# Patient Record
Sex: Male | Born: 1965 | Race: Black or African American | Hispanic: No | Marital: Married | Smoking: Never smoker
Health system: Southern US, Community
[De-identification: ages and names within clinical notes are randomized; demographics above are authoritative.]

## PROBLEM LIST (undated history)

## (undated) DIAGNOSIS — K562 Volvulus: Secondary | ICD-10-CM

## (undated) HISTORY — DX: Volvulus: K56.2

---

## 2001-06-06 ENCOUNTER — Emergency Department (HOSPITAL_COMMUNITY): Admission: EM | Admit: 2001-06-06 | Discharge: 2001-06-06 | Payer: Self-pay | Admitting: *Deleted

## 2003-09-27 ENCOUNTER — Encounter: Admission: RE | Admit: 2003-09-27 | Discharge: 2003-09-27 | Payer: Self-pay | Admitting: Specialist

## 2004-12-28 ENCOUNTER — Emergency Department (HOSPITAL_COMMUNITY): Admission: EM | Admit: 2004-12-28 | Discharge: 2004-12-28 | Payer: Self-pay | Admitting: Family Medicine

## 2005-04-18 ENCOUNTER — Emergency Department (HOSPITAL_COMMUNITY): Admission: EM | Admit: 2005-04-18 | Discharge: 2005-04-18 | Payer: Self-pay | Admitting: Family Medicine

## 2005-05-10 ENCOUNTER — Emergency Department (HOSPITAL_COMMUNITY): Admission: EM | Admit: 2005-05-10 | Discharge: 2005-05-10 | Payer: Self-pay | Admitting: Emergency Medicine

## 2005-05-28 ENCOUNTER — Ambulatory Visit: Payer: Self-pay | Admitting: Internal Medicine

## 2005-06-25 ENCOUNTER — Ambulatory Visit: Payer: Self-pay | Admitting: Sports Medicine

## 2005-08-16 ENCOUNTER — Ambulatory Visit: Payer: Self-pay | Admitting: Family Medicine

## 2005-09-22 ENCOUNTER — Emergency Department (HOSPITAL_COMMUNITY): Admission: EM | Admit: 2005-09-22 | Discharge: 2005-09-22 | Payer: Self-pay | Admitting: Emergency Medicine

## 2005-10-23 ENCOUNTER — Emergency Department (HOSPITAL_COMMUNITY): Admission: EM | Admit: 2005-10-23 | Discharge: 2005-10-24 | Payer: Self-pay | Admitting: Emergency Medicine

## 2005-10-24 ENCOUNTER — Emergency Department (HOSPITAL_COMMUNITY): Admission: EM | Admit: 2005-10-24 | Discharge: 2005-10-24 | Payer: Self-pay | Admitting: Emergency Medicine

## 2005-12-18 ENCOUNTER — Ambulatory Visit: Payer: Self-pay | Admitting: Family Medicine

## 2006-01-01 ENCOUNTER — Ambulatory Visit: Payer: Self-pay | Admitting: Family Medicine

## 2006-07-29 ENCOUNTER — Ambulatory Visit (HOSPITAL_COMMUNITY): Admission: RE | Admit: 2006-07-29 | Discharge: 2006-07-29 | Payer: Self-pay | Admitting: Family Medicine

## 2006-07-29 ENCOUNTER — Ambulatory Visit: Payer: Self-pay | Admitting: Family Medicine

## 2006-10-29 DIAGNOSIS — K562 Volvulus: Secondary | ICD-10-CM

## 2006-10-29 HISTORY — DX: Volvulus: K56.2

## 2006-10-29 HISTORY — PX: LAPAROSCOPIC SIGMOID COLECTOMY: SHX5928

## 2006-12-26 DIAGNOSIS — K589 Irritable bowel syndrome without diarrhea: Secondary | ICD-10-CM

## 2006-12-26 DIAGNOSIS — K210 Gastro-esophageal reflux disease with esophagitis: Secondary | ICD-10-CM

## 2006-12-26 DIAGNOSIS — J309 Allergic rhinitis, unspecified: Secondary | ICD-10-CM | POA: Insufficient documentation

## 2007-06-23 ENCOUNTER — Ambulatory Visit: Payer: Self-pay | Admitting: Family Medicine

## 2007-06-23 ENCOUNTER — Inpatient Hospital Stay (HOSPITAL_COMMUNITY): Admission: EM | Admit: 2007-06-23 | Discharge: 2007-07-05 | Payer: Self-pay | Admitting: Emergency Medicine

## 2007-06-23 ENCOUNTER — Ambulatory Visit: Payer: Self-pay | Admitting: Sports Medicine

## 2007-06-26 ENCOUNTER — Encounter (INDEPENDENT_AMBULATORY_CARE_PROVIDER_SITE_OTHER): Payer: Self-pay | Admitting: Surgery

## 2007-07-15 ENCOUNTER — Encounter: Payer: Self-pay | Admitting: Family Medicine

## 2007-08-05 ENCOUNTER — Encounter: Payer: Self-pay | Admitting: Family Medicine

## 2007-11-21 ENCOUNTER — Ambulatory Visit: Payer: Self-pay | Admitting: Family Medicine

## 2007-11-21 ENCOUNTER — Telehealth: Payer: Self-pay | Admitting: *Deleted

## 2007-11-21 DIAGNOSIS — J029 Acute pharyngitis, unspecified: Secondary | ICD-10-CM | POA: Insufficient documentation

## 2007-11-21 LAB — CONVERTED CEMR LAB: Rapid Strep: POSITIVE

## 2007-11-24 ENCOUNTER — Encounter: Payer: Self-pay | Admitting: Family Medicine

## 2007-12-05 ENCOUNTER — Telehealth: Payer: Self-pay | Admitting: *Deleted

## 2007-12-09 ENCOUNTER — Telehealth: Payer: Self-pay | Admitting: *Deleted

## 2008-02-25 ENCOUNTER — Telehealth: Payer: Self-pay | Admitting: *Deleted

## 2008-02-26 ENCOUNTER — Ambulatory Visit: Payer: Self-pay | Admitting: Family Medicine

## 2008-02-26 DIAGNOSIS — M549 Dorsalgia, unspecified: Secondary | ICD-10-CM | POA: Insufficient documentation

## 2008-04-14 ENCOUNTER — Ambulatory Visit: Payer: Self-pay | Admitting: Family Medicine

## 2008-04-14 DIAGNOSIS — M5430 Sciatica, unspecified side: Secondary | ICD-10-CM | POA: Insufficient documentation

## 2009-08-19 ENCOUNTER — Ambulatory Visit: Payer: Self-pay | Admitting: Family Medicine

## 2009-08-19 LAB — CONVERTED CEMR LAB: Rapid Strep: POSITIVE

## 2010-02-03 ENCOUNTER — Telehealth: Payer: Self-pay | Admitting: Family Medicine

## 2010-03-28 ENCOUNTER — Ambulatory Visit: Payer: Self-pay | Admitting: Family Medicine

## 2010-03-28 DIAGNOSIS — J111 Influenza due to unidentified influenza virus with other respiratory manifestations: Secondary | ICD-10-CM | POA: Insufficient documentation

## 2010-03-28 LAB — CONVERTED CEMR LAB: Rapid Strep: NEGATIVE

## 2010-11-28 NOTE — Assessment & Plan Note (Signed)
Summary: flu,tcb   Vital Signs:  Patient profile:   45 year old male Weight:      206 pounds Temp:     100.6 degrees F oral Pulse rate:   92 / minute BP sitting:   116 / 75  Vitals Entered By: Jone Baseman CMA (Mar 28, 2010 10:08 AM) CC: ? flu Is Patient Diabetic? No Pain Assessment Patient in pain? yes     Location: all over Intensity: 9   Primary Care Provider:  Neena Rhymes  MD  CC:  ? flu.  History of Present Illness: 1. Flu like symptoms:  pt presents to clinic with flu like symptoms for 1 day.  They started all of a sudden yesterday afternoon.  His symptoms include body aches, fever, sore throat, runny nose, and headache.  No one else has been sick around him.  He has not been outside recently or had any tick bites      ROS: endorses diarrhea.  Denies vision changes, n/v, abdominal pain, stiff neck, rashes  Habits & Providers  Alcohol-Tobacco-Diet     Tobacco Status: never  Current Medications (verified): 1)  Flexeril 10 Mg  Tabs (Cyclobenzaprine Hcl) .... One Tablet Up To Three Times A Day As Needed Pain (Caution - Will Cause Sleepiness) 2)  Ibuprofen 800 Mg  Tabs (Ibuprofen) .... One Tablet By Mouth Three Times A Day For 5 Days Then As Needed 3)  Omeprazole 40 Mg  Cpdr (Omeprazole) .Marland Kitchen.. 1 Tab By Mouth Daily 4)  Sudafed 30 Mg Tabs (Pseudoephedrine Hcl) .... Take 1-2 Tabs Every 6 Hours As Needed For Congestion 5)  Doxycycline Hyclate 100 Mg Tabs (Doxycycline Hyclate) .... Take 1 Tab By Mouth Twice A Day For 7 Days  Allergies: No Known Drug Allergies  Past History:  Past Medical History: Reviewed history from 12/26/2006 and no changes required. IBS predominantely constipated  Social History: Reviewed history from 12/26/2006 and no changes required. Lives with wife and 5 children- oldest daughter is 75, youngest is 2.  Works as a Architectural technologist.  Arrived in [place] from [place] in 2000.  Physical Exam  General:  VS reviewed, low grade fever  Well-developed,well-nourished,in no acute distress; alert,appropriate and cooperative throughout examination Head:  normocephalic and atraumatic.   Eyes:  vision grossly intact.  PERRL.  EOMI.  conjunctiva slightly irritated. Ears:  R ear normal and L ear normal.   Nose:  clear nasal discharge Mouth:  pharnyx red without exudate Neck:  supple, full ROM, and no masses.  No meningeal signs. Lungs:  Normal respiratory effort, chest expands symmetrically. Lungs are clear to auscultation, no crackles or wheezes. Heart:  Normal rate and regular rhythm. S1 and S2 normal without gallop, murmur, click, rub or other extra sounds. Abdomen:  soft, non-tender, normal bowel sounds, no distention, no masses, and no guarding.   Msk:  normal ROM, no joint tenderness, and no joint swelling.   Extremities:  no lower extremity edema Neurologic:  alert & oriented X3, cranial nerves II-XII intact, strength normal in all extremities, and sensation intact to light touch.   Skin:  no rashes and no suspicious lesions.   Psych:  not anxious appearing.   Additional Exam:  Exam after Toradol injection:  Feeling much better.  Headache and body aches improved.   Impression & Recommendations:  Problem # 1:  INFLUENZA LIKE ILLNESS (ICD-487.1) Assessment New  Likely viral.  Low risk for RMSF but do not want to miss this.  No signs of meningitis.  Will treat supportively for the next 24 hours.  If not improved by then will treat for RMSF.  Precautions given.  Orders: [facility]- Est Level  3 (29562)  Complete Medication List: 1)  Flexeril 10 Mg Tabs (Cyclobenzaprine hcl) .... One tablet up to three times a day as needed pain (caution - will cause sleepiness) 2)  Ibuprofen 800 Mg Tabs (Ibuprofen) .... One tablet by mouth three times a day for 5 days then as needed 3)  Omeprazole 40 Mg Cpdr (Omeprazole) .Marland Kitchen.. 1 tab by mouth daily 4)  Sudafed 30 Mg Tabs (Pseudoephedrine hcl) .... Take 1-2 tabs every 6 hours as needed for  congestion 5)  Doxycycline Hyclate 100 Mg Tabs (Doxycycline hyclate) .... Take 1 tab by mouth twice a day for 7 days  Other Orders: Rapid Strep-[facility] (13086) Ketorolac-Toradol 15mg  (V7846)  Patient Instructions: 1)  I think that you most likely have a virus 2)  You can take Tylenol / Ibuprofen as needed for the pain / fever 3)  If you are not feeling any better by tomorrow I would like for you to go fill the prescription for the antibiotic 4)  If in 3 days you are still feeling bad please return to clinic to be seen 5)  If you get worse before then and can't come in to clinic please go to the ED for evaluation Prescriptions: DOXYCYCLINE HYCLATE 100 MG TABS (DOXYCYCLINE HYCLATE) Take 1 tab by mouth twice a day for 7 days  #14 x 0   Entered and Authorized by:   Angelena Sole MD   Signed by:   Angelena Sole MD on 03/28/2010   Method used:   Print then Give to Patient   RxID:   [id number]   Laboratory Results  Date/Time Received: Mar 28, 2010 10:14 AM  Date/Time Reported: Mar 28, 2010 10:22 AM   Other Tests  Rapid Strep: negative Comments: ...........test performed by...........Marland KitchenTerese Door, CMA     Medication Administration  Injection # 1:    Medication: Ketorolac-Toradol 15mg     Diagnosis: SORE THROAT (ICD-462)    Route: IM    Site: LUOQ gluteus    Exp Date: 04/29/2011    Lot #: [id number]    Mfr: [email or web address]    Patient tolerated injection without complications    Given by: Jone Baseman CMA (Mar 28, 2010 10:30 AM)  Orders Added: 1)  Rapid Strep-[facility] [87430] 2)  Ketorolac-Toradol 15mg  [J1885] 3)  [facility]- Est Level  3 [44034]

## 2010-11-28 NOTE — Progress Notes (Signed)
Summary: triage   Phone Note Call from Patient Call back at Home Phone [phone]   Caller: Patient Summary of Call: Pt wanting to see if he and his daughter can be seen today. Initial call taken by: Clydell Hakim,  February 03, 2010 11:50 AM  Follow-up for Phone Call        his R shoulder hurts. able to move but painful. happened after exercising. not taking any meds. has just bought bengay & heating pad. advised ibuprofen with food.  dtr has a cold x 3 days. no fever now. head congested. ear pain. congestion. she is not a pt here. advised UC. he will be seen there as well as he will be there with dtr. he asked abut getting his dtr a pt here. told him to call 1st of may as now we are not taking new pts Follow-up by: Golden Circle RN,  February 03, 2010 12:16 PM

## 2011-03-13 NOTE — H&P (Signed)
NAMESAJID, RUPPERT           ACCOUNT NO.:  [id number]   MEDICAL RECORD NO.:  [id number]          PATIENT TYPE:  INP   LOCATION:  5703                         FACILITY:  [facility]   PHYSICIAN:  Santiago Bumpers. Hensel, M.D.DATE OF BIRTH:  Oct 01, 1966   DATE OF ADMISSION:  06/23/2007  DATE OF DISCHARGE:                              HISTORY & PHYSICAL   DICTATED BY:  Alcide Evener, 4th year medical student.   CHIEF COMPLAINT:  Abdominal pain and diarrhea.   HISTORY OF PRESENT ILLNESS:  The patient is a 45 year old male who  developed sudden onset abdominal pain on Thursday August 21.  The  patient denies nausea and vomiting, but has a long history of  constipation alternating with diarrhea.  The patient describes the pain  as severe lower abdominal cramping, initially intermittent, but now  severe and constant.  The patient's wife reports the patient's pain  acutely worsened on Sunday June 22, 2007 and he developed scant,  watery diarrhea.  The [facility] and diagnosed with an IBS flare.  His symptoms were  consistent with previous IBS flares.  He was told to take  MiraLax which  had worked for him in the past and to use a  suppository as needed.  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  Barium  enema was unsuccessful in reducing the volvulus, therefore surgery was  consulted but deferred to GI as it was a GI issue.  GI will take the  patient for endoscopy and rectal tube placement tonight.   PAST MEDICAL HISTORY:  IBS.   PAST SURGICAL HISTORY:  None   FAMILY HISTORY:  Mother with peptic ulcer disease.   MEDICATIONS:  None.   ALLERGIES:  NO KNOWN DRUG ALLERGIES.   SOCIAL HISTORY:  The patient lives with his wife and six children.  Smokes no tobacco, drinks no alcohol and does not use drugs.  Moved from  the [place] 8 years  ago.   REVIEW OF SYSTEMS:  Positive for abdominal pain and diarrhea as above,  otherwise noncontributory.   PHYSICAL EXAM:  Temperature 97.2, blood pressure 118-132 over 77-81,  pulse 72-83, breathing 18-20 times a minute, satting 96-98% on room air.  GENERAL: Alert and oriented in no apparent distress, resting  comfortably.  HEENT: PERRL, EOMI and moist mucous membranes.  No pharyngeal erythema  or exudate.  NECK: Supple without lymphadenopathy.  CARDIOVASCULAR: Regular S1-S2 no murmurs, gallops or rubs, hyperactive  bowel sounds appreciated at upper sternal border.  LUNGS: Clear to auscultation bilaterally.  ABDOMEN: Distended with hyperactive bowel sounds.  Tender to palpation  diffusely.  EXTREMITIES: Warm and well-perfused without clubbing,  cyanosis or edema, +2 DP pulses.  NEURO: Cranial nerves II-XII grossly intact.  No focal deficit.   LABS:  White count 7.9, hematocrit 15.9, hemoglobin of  15.9, hematocrit  45.7, platelets 167, white count with 86% neutrophils.  Sodium 142,  potassium 3.8, chloride 106, bicarb 27, creatinine 1.5, BUN 12, glucose  97.  Abdominal x-ray showed sigmoid volvulus.   ASSESSMENT:  A 45 year old male with a sigmoid volvulus, otherwise  healthy.   PLAN:  1. Sigmoid volvulus.  This was diagnosed on x-ray in the emergency      department likely due to chronic constipation with leg thinning of      the sigmoid mesentery over time.  GI will take the patient to      endoscopy for detorsion and placement of rectal tube.  According to      up to date guidelines the next step is elective sigmoid colectomy      to prevent recurrence.  Will consult surgery in a.m.  The patient      now n.p.o. for procedure with IVF running at 150 mL per hour.  2. Elevated creatinine.  This is likely prerenal in etiology due to      dehydration with recent decreased p.o. intake.  Will recheck in      a.m. status post hydration.  Baseline unknown.  3. FENGI: The patient on  D5 half normal saline at 150 mL per hour      while n.p.o. will advance diet per GI and surgery.  4. Disposition pending the patient's ability to tolerate p.o. and      outcome of GI and surgical procedures as needed.      Neena Rhymes, M.D.  Electronically Signed      Santiago Bumpers. Leveda Anna, M.D.  Electronically Signed    KT/MEDQ  D:  06/24/2007  T:  06/24/2007  Job:  [id number]

## 2011-03-13 NOTE — Op Note (Signed)
NAMEKESLEY, GAFFEY           ACCOUNT NO.:  [id number]   MEDICAL RECORD NO.:  [id number]          PATIENT TYPE:  INP   LOCATION:  5703                         FACILITY:  [facility]   PHYSICIAN:  Anselmo Rod, M.D.  DATE OF BIRTH:  May 07, 1966   DATE OF PROCEDURE:  06/23/2007  DATE OF DISCHARGE:                               OPERATIVE REPORT   PROCEDURE PERFORMED:  Flexible sigmoidoscopy up to 90 cm.   ENDOSCOPIST:  Anselmo Rod, M.D.   INSTRUMENT USED:  Pentax video colonoscope.   INDICATIONS FOR PROCEDURE:  A 45 year old African male with a history of  sigmoid volvulus undergoing a flexible sigmoidoscopy for decompression  purposes.   PREPROCEDURE PREPARATION:  Informed consent was procured from the  patient.  The patient fasted for 4 hours prior to the procedure.  The  risk and benefits of the procedure were discussed with the patient in  detail.   PREPROCEDURE PHYSICAL:  The patient has stable vital signs.  The neck is  supple.  The chest is clear to auscultation.  S1 and S2 regular.  Abdomen soft, distended with high-pitched tinkle, left lower quadrant  tenderness on palpation with guarding, no rebound, no rigidity.   DESCRIPTION OF PROCEDURE:  The patient was placed in the left lateral  decubitus position and sedated with 75 mcg of Fentanyl and 7.5 mg of  Versed given intravenously in slow incremental doses.  Once the patient  was adequately sedated and maintained on low flow oxygen and continuous  cardiac monitoring, the Pentax video colonoscope was advanced from the  rectum to 90 cm.  The patient had a significantly dilated sigmoid colon  with some debris in the dependent areas of the colon.  The air was  gently suctioned out and the scope was withdrawn gradually, suctioning  out more air while withdrawing the scope.  There was no evidence of  diverticulosis.  No masses or polyps were seen.  Small lesions could be  missed as there was a significant amount of  residual debris in the  dependent areas of the sigmoid colon and rectosigmoid colon.  Retroflexion in the rectum revealed no abnormalities.  The patient  tolerated the procedure well without immediate complications.   IMPRESSION:  1. Sigmoid volvulus decompressed. Redundant sigmoid colon. The scope      was advanced up to 90 cm.  2. Large amount of liquid stool in the dependent areas, small lesions      could be missed.  3. No evidence of diverticulosis, masses or polyps.   RECOMMENDATIONS:  1. Review films in the a.m.  2. Surgical evaluation as discussed with Dr. Karie Soda.  3. Clear liquid diet.  4. Further recommendations from the Surgical service, Dr. Darnell Level.      Anselmo Rod, M.D.  Electronically Signed     JNM/MEDQ  D:  06/24/2007  T:  06/25/2007  Job:  [id number]   cc:   Hal Hope, NP

## 2011-03-13 NOTE — H&P (Signed)
[name], [name]           ACCOUNT NO.:  [id number]   MEDICAL RECORD NO.:  [id number]          PATIENT TYPE:  INP   LOCATION:  5703                         FACILITY:  [facility]   PHYSICIAN:  Santiago Bumpers. Hensel, M.D.DATE OF BIRTH:  January 05, 1966   DATE OF ADMISSION:  06/23/2007  DATE OF DISCHARGE:                              HISTORY & PHYSICAL   CHIEF COMPLAINT:  Abdominal pain and diarrhea.   HISTORY OF PRESENT ILLNESS:  This is a 45 year old gentleman who  developed sudden-onset abdominal pain Thursday, June 19, 2007.  Patient denies nausea and vomiting, but has a long history of  constipation alternating with diarrhea.  Patient describes pain as  severe, lower abdominal cramping which was initially intermittent, but  now severe and constant.  Patient's pain acutely worsened Sunday, August  24; he developed scant watery diarrhea.  Patient was seen this morning  at [facility] and was diagnosed with irritable  bowel flare and told to take MiraLax and use suppository as needed.  Due  to intractable pain, patient presented to ED and was found to have a  sigmoid volvulus on x-ray.  Barium enema was unsuccessful in reducing  volvulus and surgery was consulted, but stated it was a GI issue.  GI  taking patient for endoscopy and rectal tube placement.   PAST MEDICAL HISTORY:  Irritable bowel syndrome.   PAST SURGICAL HISTORY:  None.   MEDICATIONS:  None.   ALLERGIES:  No known drug allergies.   FAMILY HISTORY:  Mother with peptic ulcer disease.   SOCIAL HISTORY:  Lives with wife and six children, denies tobacco,  alcohol and drug use.  Moved here from the [place] eight years ago.   REVIEW OF SYSTEMS:  Positive for abdominal pain and diarrhea as above;  also positive for decreased p.o. intake, otherwise negative.   PHYSICAL EXAMINATION:  VITAL SIGNS:  Temp is 97.2, blood pressure 118 to  132/77 to 81, pulse 72 to 83, respirations 18 to 20, sating 96 to  98% on  room air.  GENERAL:  Patient is awake, alert, oriented in no acute distress and  resting comfortably.  HEENT EXAM:  Pupils are equal, round and reactive to light.  Extraocular  muscles are intact.  Patient has moist mucous membranes with no  pharyngeal erythema or exudate.  NECK:  Supple without lymphadenopathy.  CARDIOVASCULAR EXAM:  Regular S1 and S2.  No murmurs, rubs or gallops.  Hyperactive bowel sounds appreciated at upper sternal border.  LUNGS:  Clear to auscultation bilaterally.  ABDOMEN:  Distended with hyperactive bowel sounds.  Tender to palpation  diffusely.  EXTREMITIES:  No clubbing, cyanosis or edema with +2 DP pulses.  NEURO EXAM:  Cranial nerves II-XII grossly intact with no focal  deficits.   LABORATORY DATA:  WBC 7.9, hemoglobin 15.9, hematocrit 45.7, platelets  167, 86% neutrophils, 9% lymphocytes, 5% monocytes.  Sodium is 142,  potassium is 3.8, chloride is 106, bicarb is 27, BUN is 12, creatinine  is 1.5, glucose 97.   X-ray shows sigmoid volvulus.   ASSESSMENT/PLAN:  This is a 45 year old gentleman with sigmoid volvulus,  otherwise healthy.   1. Sigmoid volvulus diagnosed on x-ray in ED, likely secondary to      chronic constipation or likely sigmoid mesentery.  Gastroenterology      to take patient to endo for detorsion and placement of rectal tube.      According to article, the next step is elective sigmoid colectomy      to prevent recurrence.  Will consult surgery in the a.m.  Patient      NPO for procedure with IV fluids at 150 mL per hour.  2. Increased creatinine.  Patient's creatinine of 1.5 is likely      prerenal secondary to his dehydration with recent decreased p.o.      intake.  Will check in a.m. status post hydration.  We do not have      a baseline creatinine on the patient.  3. FEN/GI:  Patient will have D5 half-normal saline at 150 mL per hour      while NPO and we will advance his diet per gastroenterology and      surgery.   4. Disposition.  Pending patient's ability to tolerate p.o.      Neena Rhymes, M.D.  Electronically Signed      Santiago Bumpers. Leveda Anna, M.D.  Electronically Signed    KT/MEDQ  D:  06/24/2007  T:  06/24/2007  Job:  [id number]

## 2011-03-13 NOTE — Op Note (Signed)
[name], [name]           ACCOUNT NO.:  [id number]   MEDICAL RECORD NO.:  [id number]          PATIENT TYPE:  INP   LOCATION:  5703                         FACILITY:  [facility]   PHYSICIAN:  Velora Heckler, MD      DATE OF BIRTH:  1966-02-10   DATE OF PROCEDURE:  06/26/2007  DATE OF DISCHARGE:                               OPERATIVE REPORT   PREOPERATIVE DIAGNOSIS:  Sigmoid volvulus.   POSTOPERATIVE DIAGNOSIS:  Sigmoid volvulus.   PROCEDURE:  1. Sigmoid colectomy.  2. Incidental appendectomy.   SURGEON:  Velora Heckler, MD, FACS   ANESTHESIA:  General per Dr. Diamantina Monks.   ESTIMATED BLOOD LOSS:  Minimal.   PREPARATION:  Betadine.   COMPLICATIONS:  None.   INDICATIONS:  The patient is a 45 year old black male admitted on the  medical service with abdominal pain.  He was found to have sigmoid  volvulus.  This failed to reduce with barium enema.  He was reduced  successfully with a colonoscopy by Dr. Charna Elizabeth. The patient has had  approximately 10 years of symptoms and has likely been missed diagnosed  with irritable bowel syndrome.  He now comes to surgery for exploratory  laparotomy and sigmoid colectomy for management of chronic sigmoid  volvulus.   DESCRIPTION OF PROCEDURE:  The procedure was done in OR #17 at the [facility].  The patient is brought to the operating  room, placed in supine position on the operating room table.  Following  the administration of general anesthesia, the patient is prepped and  draped in the usual strict aseptic fashion.  After ascertaining that an  adequate level of anesthesia had been obtained, a midline abdominal  incision was made with a #10 blade, dissection was carried down to the  fascia.  The fascia was incised in the midline, the peritoneal cavity is  entered cautiously.  The abdomen is explored.  There is a large  redundant loop of sigmoid colon which was somewhat fixed at its apex to  the  retroperitoneum.  It is quite dilated and fluid-filled.  The  remainder of the descending, transverse, and ascending colon are grossly  normal.  The distal sigmoid colon and rectum appear grossly normal.  There is a small amount of ascites fluid present within the peritoneal  cavity.   A point in the distal sigmoid colon is selected and transected with a  GIA stapler.  The mesentery is taken down using the LigaSure.  The  larger mesenteric vessels are taken down between Kelly clamps with the  LigaSure and then suture ligated with 2-0 silk suture ligatures.  A  point in the distal descending colon is selected and transected with the  GIA stapler.   Next a side-to-side functionally end-to-end anastomosis is created  between the distal descending colon and distal sigmoid colon.  The two  portions of bowel are lined with interrupted 2-0 silk sutures.  Enterotomies are made.  A GIA stapler was inserted and fired.  The  staple line is inspected closely for hemostasis and completeness.  The  enterotomy was then  closed with a TA-60 stapler. A suture is placed at  the end of the staple line to assure that there is no tension on the  anastomosis.  Anastomosis appears widely patent.  Good hemostasis was  noted at all staple lines.  Mesenteric defect is closed with interrupted  2-0 silk sutures.  Abdomen is then copiously irrigated with warm saline.  Upon returning the bowel to the peritoneal cavity, it was noted that the  appendix was partially retrocecal and adherent to the retroperitoneum.  A decision was made to proceed with an incidental appendectomy.  The  mesoappendix was taken down with the LigaSure.  The base of the appendix  was ligated with a 2-0 silk ligature and the appendix was transected  with the LigaSure and passed off the field.  The appendiceal stump was  inverted with a 3-0 silk Z stitch.  The abdomen is then irrigated  copiously with warm saline which was evacuated.  The  viscera are  returned to their normal position.  Omentum was used to cover the small  bowel.  On the abdominal wall fascia, there is a small umbilical hernia  which is excised.  The fascia was then closed with interrupted #1 Vicryl  sutures. The subcutaneous tissues are irrigated.  The skin is closed  with stainless steel staples.  Sterile dressings are applied.  The  patient is awakened from anesthesia and brought to the recovery room in  stable condition.  The patient tolerated the procedure well.      Velora Heckler, MD  Electronically Signed     TMG/MEDQ  D:  06/26/2007  T:  06/27/2007  Job:  [id number]   cc:   Anselmo Rod, M.D.  William A. Leveda Anna, M.D.

## 2011-03-13 NOTE — Consult Note (Signed)
NAMESHAMAL, STRACENER NO.:  [id number]   MEDICAL RECORD NO.:  [id number]          PATIENT TYPE:  INP   LOCATION:  5703                         FACILITY:  [facility]   PHYSICIAN:  Velora Heckler, MD      DATE OF BIRTH:  Mar 07, 1966   DATE OF CONSULTATION:  06/24/2007  DATE OF DISCHARGE:                                 CONSULTATION   GENERAL SURGERY CONSULTATION   PRIMARY CARE PHYSICIAN:  Neena Rhymes, M.D., of the internal  medicine clinics.   REASON FOR CONSULTATION:  Sigmoid volvulus.   HISTORY OF PRESENT ILLNESS:  [name] is a pleasant 40-year male  patient with history of at least 10 years' worth of what has been  described as irritable bowel symptoms, currently not on any medications,  had been on MiraLax in the past but recently stopped.  He is followed by  the [facility] Medicine Clinics.  Apparently was symptom-free for the past  2 years until the stopped the MiraLax.  Over the past few weeks the  patient has had increasing gas symptoms not relieved with Mylicon and  has noticed a decrease in the amount of stool passed on a daily basis  and over the past 2 days has really not passed any notable stool.  He  presented to the ER yesterday evening with classic radiographic evidence  of a sigmoid volvulus on barium enema x-ray.  His plain films also  showed massive colonic distention and a large amount of stool in the  cecal region.  Eventually the patient was taken to the endo lab by Dr.  Loreta Ave and required decompression with a large volume of liquid stool  returned and significant improvement in the patient's abdominal  symptoms.  According to the colonoscopy report there are no apparent  lesions, masses or other abnormalities in the colon.  Surgical  consultation has been requested.   REVIEW OF SYSTEMS:  The patient does not take any prescribed medications  for irritable bowel symptoms.  He does use over-the-counter Mylicon.  In  the past he has remotely  used Imodium, but this has been more than 5  years since he has used this.  He has had a very severe episode similar  to this one about 12-14 years ago but he eventually had his symptoms  resolved and did not require hospitalization.  He has no other reported  GI or other symptoms.   SOCIAL HISTORY:  No alcohol.  No tobacco.  He is married.  He has six  children.  He works as a Electrical engineer.  He is originally from the  [place].   PAST MEDICAL HISTORY:  IBS with primary symptom of constipation.   PAST SURGICAL HISTORY:  None.   ALLERGIES:  No known drug allergies.   HOME MEDICATIONS:  None.   PHYSICAL EXAM:  GENERAL:  A pleasant male patient reporting decreased  abdominal pain after endoscopy.  VITAL SIGNS:  Temperature 97.6, BP 133/67, pulse 65, respirations 20.  NEUROLOGIC:  The patient is alert and oriented x3, moving extremities x4  without focal deficits.  HEENT:  Head normocephalic, sclerae  are mildly injected.  NECK:  Supple with no adenopathy.  CHEST:  Bilateral lung sounds are clear to auscultation.  His  respiratory effort is nonlabored.  He is on room air.  CARDIAC:  S1-S2 without rubs, murmurs, thrills or gallops.  No JVD.  Pulses irregular and non-tachycardiac.  ABDOMEN:  Distended but soft.  Active bowel sounds.  He is nontender,  slightly tympanitic on percussion.  EXTREMITIES:  Symmetrical in appearance without edema, cyanosis or  clubbing.   LAB:  White count 5300, hemoglobin 13, platelets are 134,000.  Sodium  141, potassium 3.4, CO2 26, BUN 12, creatinine 1.09, glucose 115,  alkaline phosphatase 29.   DIAGNOSTICS:  Plain abdominal films two-view showed massively dilated  colon, especially in the splenic flexure, as well as stool in the cecal  region.  Barium enema x-ray showed a classic bird beak sign at the  sigmoid colon.  Colonoscopy as noted with liquid stool, otherwise no  abnormalities documented.   IMPRESSION:  1. Sigmoid volvulus, symptoms  improved after endoscopic decompression.  2. Colonic distention secondary to problem #1.  3. Mild hypokalemia.   PLAN:  1. Agree with GoLYTELY as ordered per Dr. Michaell Cowing to finish prepping out      the abdomen, noting that the patient again had a large volume of      stool seen on pre-endo films.  2. Prophylactic potassium orally x1 since the patient is on GoLYTELY.      Wish to avoid secondary hypokalemia, which will contribute to      decreased intestinal motility.  3. The patient may or may not require eventual OR procedure for      segmental resection of the sigmoid colon.  This is his first      episode requiring hospitalization and apparently had been doing      well until he stopped his MiraLax.  4. Recommend lifelong MiraLax and other aggressive bowel regimen.   ADDENDUM 24 June 2007 at 3:20PM:  I discussed the case with Junious Silk and with Dr. Loreta Ave.  I reviewed the medical record and the note by  the [facility] Attending physician.  Plan to proceed with gentle bowel  preparation followed by sigmoid colectomy this admission.  TMG      [name], N.P.      Velora Heckler, MD  Electronically Signed    ALE/MEDQ  D:  06/24/2007  T:  06/24/2007  Job:  [id number]   cc:   Neena Rhymes, M.D.  Anselmo Rod, M.D.  Velora Heckler, MD

## 2011-03-16 NOTE — Discharge Summary (Signed)
[name], [name]           ACCOUNT NO.:  [id number]   MEDICAL RECORD NO.:  [id number]          PATIENT TYPE:  INP   LOCATION:  5703                         FACILITY:  [facility]   PHYSICIAN:  Currie Paris, M.D.DATE OF BIRTH:  1966/08/27   DATE OF ADMISSION:  06/23/2007  DATE OF DISCHARGE:  07/05/2007                               DISCHARGE SUMMARY   OPERATIVE PHYSICIAN:  Velora Heckler, M.D.   CHIEF COMPLAINT/REASON FOR ADMISSION:  Mr. Baria is a 40-year male  patient with abdominal pain associated with diarrhea.  X-rays done in  the ER showed what looked like a sigmoid volvulus.  Barium enema x-ray  was attempted but was unsuccessful in reducing the volvulus.  A GI  consultation was obtained with [facility]; Dr. Loreta Ave was following with the  patient, and initial plan was flexible sigmoidoscopy with placement of  rectal tube for decompression, as well as a surgical evaluation in the  event this was unsuccessful in helping decompress the patient.  The  patient was admitted with a diagnosis of sigmoid volvulus.   HOSPITAL COURSE:  The patient was admitted to the general floor.  He was  seen by surgery in consultation on June 24, 2007 with Dr. Michaell Cowing, at  which point the patient was undergoing a GoLYTELY flush prep due to a  large volume of stool seen on the pre-endoscopic films.  The patient was  also given potassium to help with prevention of hypokalemia, and  depending on medical therapy, the patient may end up going to the OR for  surgical resection.   Unfortunately, the patient did not improve from a medical standpoint,  and Dr. Gerrit Friends had to talk with the patient and his wife and felt it was  best for the patient to undergo sigmoid colectomy to help with the  sigmoid volvulus issues based on his history.  The patient has had this  in the past and has had a progression of symptoms over the past several  years that had been treated as chronic constipation, which has  probably  all been related to chronic colonic distention secondary to intermittent  sigmoid volvulus.   On June 26, 2007, the patient was taken to the OR by Dr. Gerrit Friends where  he underwent a sigmoid colectomy and incidental appendectomy for chronic  sigmoid volvulus.  The patient tolerated this procedure well and was  sent back to the general floor.  Over the next several days, the patient  had symptoms consistent with postoperative ileus.  He had persistent  hiccups, no flatus or BM, but otherwise was stable.  Thorazine was  attempted to help with the hiccups, but was not very successful, but as  the patient's abdominal distention decreased from a postoperative ileus,  his hiccups resolved.  By postoperative day #4, he was having bowel  movements.  His abdomen was soft and benign with bowel sounds present.  He also had some hypokalemia which was treated without difficulty.  By  postoperative day #4, he was started on a liquid diet.   Over the next several days, he still remained distended, although he was  able to eat but his diet was very slowly advanced.  At one point, he was  placed back on an n.p.o. diet because of the abdominal distention, but  by postoperative day #7, he was resumed on clear liquids.  Subsequent  pathology showed no malignancy from the surgical procedure.  By  postoperative day #8, his wound staples were discontinued and Steri-  Strips were applied.  He was tolerating a full liquid diet and having  bowel movements.  His abdomen was soft and flat with bowel sounds  present.  By supper on postoperative day #8, he was advanced to a soft  diet and plans were to allow the patient to discharge home the following  day if he tolerated solids.   By postoperative day #9, the patient tolerated the solid diet with  minimal pain.  No nausea, no vomiting.  His abdomen was soft and benign,  and he felt that he was ready to go home.  Dr. Jamey Ripa, the discharging  physician,  felt that the patient was also ready for discharge home.   FINAL DISCHARGE DIAGNOSES:  1. Abdominal pain and distention secondary to intermittent chronic      sigmoid volvulus.  2. Status post sigmoid colectomy and incidental appendectomy for      sigmoid volvulus.   DISCHARGE MEDICATIONS:  Tylox q.4h. as needed for pain.   WOUND CARE:  All Steri-Strips to fall off.   DIET:  No restrictions.   ACTIVITY:  Increase activity slowly.  May walk up steps.  May shower.  No lifting more than 50 pounds for 6 weeks.  No work for 6 weeks (please  see note).  No driving for 2 weeks.   FOLLOW UP APPOINTMENTS:  You need to call Dr. Ardine Eng office to be seen  in 2 weeks.      [name], N.P.      Currie Paris, M.D.  Electronically Signed    ALE/MEDQ  D:  08/18/2007  T:  08/19/2007  Job:  [id number]   cc:   Velora Heckler, MD

## 2011-08-10 LAB — I-STAT 8, (EC8 V) (CONVERTED LAB)
Acid-Base Excess: 1
BUN: 12
Bicarbonate: 27.2 — ABNORMAL HIGH
Chloride: 106
Glucose, Bld: 97
HCT: 49
Hemoglobin: 16.7
Operator id: 151321
Potassium: 3.8
Sodium: 142
TCO2: 29
pCO2, Ven: 47.1
pH, Ven: 7.37 — ABNORMAL HIGH

## 2011-08-10 LAB — CBC
HCT: 37 — ABNORMAL LOW
HCT: 39.3
HCT: 37.5 — ABNORMAL LOW
HCT: 38.1 — ABNORMAL LOW
HCT: 38.5 — ABNORMAL LOW
HCT: 43
HCT: 45.7
Hemoglobin: 13
Hemoglobin: 13.7
Hemoglobin: 13
Hemoglobin: 13.6
Hemoglobin: 14.2
Hemoglobin: 14.9
Hemoglobin: 15.9
MCHC: 34.9
MCHC: 35.3
MCHC: 34.5
MCHC: 34.6
MCHC: 34.7
MCV: 83.7
MCV: 84.2
MCV: 85.2
MCV: 86.3
MCV: 86.5
Platelets: 170
Platelets: 216
Platelets: 134 — ABNORMAL LOW
Platelets: 135 — ABNORMAL LOW
Platelets: 149 — ABNORMAL LOW
Platelets: 167
RBC: 4.39
RBC: 4.69
RBC: 4.41
RBC: 4.45
RBC: 4.95
RBC: 5.3
RDW: 13.2
RDW: 13.4
RDW: 13
RDW: 13.2
RDW: 13.4
RDW: 13.4
WBC: 3 — ABNORMAL LOW
WBC: 3.8 — ABNORMAL LOW
WBC: 2.6 — ABNORMAL LOW
WBC: 3.5 — ABNORMAL LOW
WBC: 5.3
WBC: 6
WBC: 7.9

## 2011-08-10 LAB — DIFFERENTIAL
Basophils Absolute: 0
Basophils Absolute: 0
Basophils Absolute: 0
Basophils Relative: 1
Basophils Relative: 1
Basophils Relative: 0
Eosinophils Absolute: 0.1
Eosinophils Absolute: 0.1
Eosinophils Absolute: 0
Eosinophils Absolute: 0.1
Eosinophils Relative: 3
Eosinophils Relative: 4
Eosinophils Relative: 0
Eosinophils Relative: 3
Lymphocytes Relative: 28
Lymphocytes Relative: 33
Lymphocytes Relative: 9 — ABNORMAL LOW
Lymphs Abs: 0.8
Lymphs Abs: 1.3
Lymphs Abs: 0.7
Lymphs Abs: 1.4
Monocytes Absolute: 0.4
Monocytes Absolute: 0.5
Monocytes Absolute: 0.4
Monocytes Absolute: 0.4
Monocytes Relative: 13 — ABNORMAL HIGH
Monocytes Relative: 15 — ABNORMAL HIGH
Monocytes Relative: 11
Monocytes Relative: 5
Neutro Abs: 1.6 — ABNORMAL LOW
Neutro Abs: 1.9
Neutro Abs: 6.8
Neutrophils Relative %: 50
Neutrophils Relative %: 53
Neutrophils Relative %: 86 — ABNORMAL HIGH

## 2011-08-10 LAB — COMPREHENSIVE METABOLIC PANEL
ALT: 22
AST: 37
Albumin: 3.2 — ABNORMAL LOW
Alkaline Phosphatase: 29 — ABNORMAL LOW
BUN: 12
CO2: 26
Calcium: 8.4
Chloride: 110
Creatinine, Ser: 1.09
GFR calc Af Amer: >60
GFR calc non Af Amer: >60
Glucose, Bld: 115 — ABNORMAL HIGH
Potassium: 3.4 — ABNORMAL LOW
Sodium: 141
Total Bilirubin: 1.1
Total Protein: 5.7 — ABNORMAL LOW

## 2011-08-10 LAB — BASIC METABOLIC PANEL
BUN: 6
BUN: 6
BUN: 7
BUN: 3 — ABNORMAL LOW
CO2: 27
CO2: 28
CO2: 30
CO2: 27
CO2: 29
Calcium: 8.8
Calcium: 8.9
Calcium: 9.2
Calcium: 8.5
Calcium: 8.7
Calcium: 8.9
Chloride: 103
Chloride: 105
Chloride: 107
Chloride: 111
Chloride: 112
Creatinine, Ser: 1.09
Creatinine, Ser: 1.19
Creatinine, Ser: 1.2
Creatinine, Ser: 1.1
Creatinine, Ser: 1.15
GFR calc Af Amer: >60
GFR calc Af Amer: >60
GFR calc Af Amer: >60
GFR calc Af Amer: >60
GFR calc Af Amer: >60
GFR calc Af Amer: >60
GFR calc non Af Amer: >60
GFR calc non Af Amer: >60
GFR calc non Af Amer: >60
GFR calc non Af Amer: >60
GFR calc non Af Amer: >60
GFR calc non Af Amer: >60
Glucose, Bld: 107 — ABNORMAL HIGH
Glucose, Bld: 111 — ABNORMAL HIGH
Glucose, Bld: 94
Glucose, Bld: 126 — ABNORMAL HIGH
Glucose, Bld: 126 — ABNORMAL HIGH
Glucose, Bld: 150 — ABNORMAL HIGH
Glucose, Bld: 170 — ABNORMAL HIGH
Potassium: 3.8
Potassium: 3.9
Potassium: 4
Potassium: 3.1 — ABNORMAL LOW
Potassium: 3.3 — ABNORMAL LOW
Potassium: 3.7
Sodium: 137
Sodium: 138
Sodium: 140
Sodium: 136
Sodium: 139
Sodium: 141
Sodium: 141

## 2011-08-10 LAB — POCT I-STAT CREATININE
Creatinine, Ser: 1.5
Operator id: 151321

## 2011-09-07 ENCOUNTER — Ambulatory Visit (INDEPENDENT_AMBULATORY_CARE_PROVIDER_SITE_OTHER): Payer: Self-pay | Admitting: Family Medicine

## 2011-09-07 ENCOUNTER — Encounter: Payer: Self-pay | Admitting: Family Medicine

## 2011-09-07 DIAGNOSIS — G44209 Tension-type headache, unspecified, not intractable: Secondary | ICD-10-CM

## 2011-09-07 DIAGNOSIS — M542 Cervicalgia: Secondary | ICD-10-CM

## 2011-09-07 MED ORDER — DIAZEPAM 5 MG PO TABS: 5.0000 mg | ORAL_TABLET | Freq: Three times a day (TID) | ORAL | Status: AC | PRN

## 2011-09-07 MED ORDER — IBUPROFEN 600 MG PO TABS: 600.0000 mg | ORAL_TABLET | Freq: Four times a day (QID) | ORAL | Status: DC | PRN

## 2011-09-07 NOTE — Assessment & Plan Note (Signed)
This is likely associated to patient's acute neck pain. Will give a prescription for high-dose ibuprofen as needed for headache. Recommended routine head and neck massages to prevent tension headaches. This patient returns with recurrent headaches, may consider referral to headache clinic. Patient to followup with PCP for chronic issues.

## 2011-09-07 NOTE — Progress Notes (Signed)
  Subjective:    Patient ID: [name], male    DOB: Sep 20, 1966, 45 y.o.   MRN: [id number]  HPI  The patient presents as a work in for her neck pain. Has been going on for 3 weeks. Pain is located on the left side of the neck no pain with rest, only painful when patient rotates his neck to the right side. Patient is able to flex and extend his neck without any difficulty. Yesterday he complained of a headache located at bilateral parietal lobes. He took over-the-counter ibuprofen which improved his headache. Patient states that he sleeps with his cell phone next to his left ear, so he keeps his neck rotated to the left while he sleeps.   Review of Systems  He denies any recent trauma or injury to his neck. Denies any fevers, chills, night sweats, nausea vomiting. Denies any watery eyes, runny nose, cough, or congestion.    Objective:   Physical Exam  Constitutional: No distress.  HENT:  Head: Normocephalic and atraumatic.  Mouth/Throat: Oropharynx is clear and moist.  Eyes: Conjunctivae are normal. Pupils are equal, round, and reactive to light. Right eye exhibits no discharge. Left eye exhibits no discharge. Scleral icterus is present.  Neck:       Normal flexion and extension of the neck. Decreased range of motion with rotation to the right side. Nontender on palpation. No erythema, swelling, warmth or bruising. No cervical adenopathy.  Cardiovascular: Normal heart sounds.  Exam reveals no gallop and no friction rub.   No murmur heard. Pulmonary/Chest: Effort normal and breath sounds normal. He has no wheezes. He has no rales.  Neurological: He is alert. No cranial nerve deficit. Coordination normal.          Assessment & Plan:

## 2011-09-07 NOTE — Patient Instructions (Addendum)
Your symptoms are consistent with muscle spasm with associated tension headache. Take Valium 5 mg every 8 hours as needed for muscle spasm for 7 days. Take Ibuprofen 600 mg every 6 hours as needed for headache. Practice neck exercises three times a day to increase range of motion. Heating pads or ice packs may be help for a few days, too. Schedule a follow up appointment with your PCP in 2-4 weeks. If you develop severe pain or numbness/tingling in extremities, please go to ED.  Torticollis, Acute You have suddenly (acutely) developed a twisted neck (torticollis). This is usually a self-limited condition. CAUSES  Acute torticollis may be caused by malposition, trauma or infection. Most commonly, acute torticollis is caused by sleeping in an awkward position. Torticollis may also be caused by the flexion, extension or twisting of the neck muscles beyond their normal position. Sometimes, the exact cause may not be known. SYMPTOMS  Usually, there is pain and limited movement of the neck. Your neck may twist to one side. DIAGNOSIS  The diagnosis is often made by physical examination. X-rays, CT scans or MRIs may be done if there is a history of trauma or concern of infection. TREATMENT  For a common, stiff neck that develops during sleep, treatment is focused on relaxing the contracted neck muscle. Medications (including shots) may be used to treat the problem. Most cases resolve in several days. Torticollis usually responds to conservative physical therapy. If left untreated, the shortened and spastic neck muscle can cause deformities in the face and neck. Rarely, surgery is required. HOME CARE INSTRUCTIONS   Use over-the-counter and prescription medications as directed by your caregiver.   Do stretching exercises and massage the neck as directed by your caregiver.   Follow up with physical therapy if needed and as directed by your caregiver.  SEEK IMMEDIATE MEDICAL CARE IF:   You develop  difficulty breathing or noisy breathing (stridor).   You drool, develop trouble swallowing or have pain with swallowing.   You develop numbness or weakness in the hands or feet.   You have changes in speech or vision.   You have problems with urination or bowel movements.   You have difficulty walking.   You have a fever.   You have increased pain.  MAKE SURE YOU:   Understand these instructions.   Will watch your condition.   Will get help right away if you are not doing well or get worse.  Document Released: 10/12/2000 Document Revised: 06/27/2011 Document Reviewed: 11/23/2009 [facility] Patient Information 2012 [place], [place]. A

## 2011-09-07 NOTE — Assessment & Plan Note (Signed)
Pain is likely musculoskeletal in etiology, torticollis. Will treat with Valium 5 mg x7 days only. Gave patient handout on torticollis and home remedies. Advised patient to exercise neck a couple times a day to relax muscles. Patient has multiple chronic issues and will need to schedule followup appointment with PCP. Patient agreed and understood plan.

## 2011-09-24 ENCOUNTER — Encounter: Payer: Self-pay | Admitting: Family Medicine

## 2011-10-12 ENCOUNTER — Encounter: Payer: Self-pay | Admitting: Family Medicine

## 2011-11-01 ENCOUNTER — Encounter: Payer: Self-pay | Admitting: Family Medicine

## 2013-07-29 ENCOUNTER — Ambulatory Visit: Payer: Self-pay

## 2013-08-05 ENCOUNTER — Ambulatory Visit: Payer: Self-pay

## 2013-10-08 ENCOUNTER — Ambulatory Visit: Payer: Self-pay

## 2013-12-17 ENCOUNTER — Ambulatory Visit: Payer: Self-pay

## 2014-01-25 ENCOUNTER — Ambulatory Visit (INDEPENDENT_AMBULATORY_CARE_PROVIDER_SITE_OTHER): Payer: Self-pay | Admitting: Family Medicine

## 2014-01-25 ENCOUNTER — Ambulatory Visit
Admission: RE | Admit: 2014-01-25 | Discharge: 2014-01-25 | Disposition: A | Discharge status: Home / Self Care | Payer: Self-pay | Source: Ambulatory Visit | Attending: Family Medicine | Admitting: Family Medicine

## 2014-01-25 ENCOUNTER — Telehealth: Payer: Self-pay | Admitting: Family Medicine

## 2014-01-25 ENCOUNTER — Ambulatory Visit: Payer: Self-pay

## 2014-01-25 ENCOUNTER — Encounter: Payer: Self-pay | Admitting: Family Medicine

## 2014-01-25 VITALS — BP 118/76 | HR 77 | Temp 98.8°F | Ht 73.0 in | Wt 198.0 lb

## 2014-01-25 DIAGNOSIS — R05 Cough: Secondary | ICD-10-CM

## 2014-01-25 DIAGNOSIS — R059 Cough, unspecified: Secondary | ICD-10-CM

## 2014-01-25 DIAGNOSIS — J029 Acute pharyngitis, unspecified: Secondary | ICD-10-CM

## 2014-01-25 LAB — POCT RAPID STREP A (OFFICE): RAPID STREP A SCREEN: NEGATIVE

## 2014-01-25 MED ORDER — BENZONATATE 100 MG PO CAPS: 100.0000 mg | ORAL_CAPSULE | Freq: Three times a day (TID) | ORAL | Status: DC | PRN

## 2014-01-25 MED ORDER — IBUPROFEN 600 MG PO TABS: 600.0000 mg | ORAL_TABLET | Freq: Three times a day (TID) | ORAL | Status: DC | PRN

## 2014-01-25 NOTE — Assessment & Plan Note (Signed)
Cough is likely explanation for pharyngitis. Strep test negative.  With productive cough and mild crackles on exam, will check xray to rule out pneumonia.  Treat cough with tessalon perles Pharyngitis treated with ibuprofen 600mg  q6/prn Red flags for return reviewed: worsening symptoms, shortness of breath, fever.Marland Kitchen..Marland Kitchen

## 2014-01-25 NOTE — Patient Instructions (Signed)
Let's get an xray of your chest to make sure there is no pneumonia.  We are otherwise, going to treat you for a viral upper respiratory infection by treating your cough and the throat pain.   Upper Respiratory Infection, Adult An upper respiratory infection (URI) is also sometimes known as the common cold. The upper respiratory tract includes the nose, sinuses, throat, trachea, and bronchi. Bronchi are the airways leading to the lungs. Most people improve within 1 week, but symptoms can last up to 2 weeks. A residual cough may last even longer.  CAUSES Many different viruses can infect the tissues lining the upper respiratory tract. The tissues become irritated and inflamed and often become very moist. Mucus production is also common. A cold is contagious. You can easily spread the virus to others by oral contact. This includes kissing, sharing a glass, coughing, or sneezing. Touching your mouth or nose and then touching a surface, which is then touched by another person, can also spread the virus. SYMPTOMS  Symptoms typically develop 1 to 3 days after you come in contact with a cold virus. Symptoms vary from person to person. They may include:  Runny nose.  Sneezing.  Nasal congestion.  Sinus irritation.  Sore throat.  Loss of voice (laryngitis).  Cough.  Fatigue.  Muscle aches.  Loss of appetite.  Headache.  Low-grade fever. DIAGNOSIS  You might diagnose your own cold based on familiar symptoms, since most people get a cold 2 to 3 times a year. Your caregiver can confirm this based on your exam. Most importantly, your caregiver can check that your symptoms are not due to another disease such as strep throat, sinusitis, pneumonia, asthma, or epiglottitis. Blood tests, throat tests, and X-rays are not necessary to diagnose a common cold, but they may sometimes be helpful in excluding other more serious diseases. Your caregiver will decide if any further tests are required. RISKS  AND COMPLICATIONS  You may be at risk for a more severe case of the common cold if you smoke cigarettes, have chronic heart disease (such as heart failure) or lung disease (such as asthma), or if you have a weakened immune system. The very young and very old are also at risk for more serious infections. Bacterial sinusitis, middle ear infections, and bacterial pneumonia can complicate the common cold. The common cold can worsen asthma and chronic obstructive pulmonary disease (COPD). Sometimes, these complications can require emergency medical care and may be life-threatening. PREVENTION  The best way to protect against getting a cold is to practice good hygiene. Avoid oral or hand contact with people with cold symptoms. Wash your hands often if contact occurs. There is no clear evidence that vitamin C, vitamin E, echinacea, or exercise reduces the chance of developing a cold. However, it is always recommended to get plenty of rest and practice good nutrition. TREATMENT  Treatment is directed at relieving symptoms. There is no cure. Antibiotics are not effective, because the infection is caused by a virus, not by bacteria. Treatment may include:  Increased fluid intake. Sports drinks offer valuable electrolytes, sugars, and fluids.  Breathing heated mist or steam (vaporizer or shower).  Eating chicken soup or other clear broths, and maintaining good nutrition.  Getting plenty of rest.  Using gargles or lozenges for comfort.  Controlling fevers with ibuprofen or acetaminophen as directed by your caregiver.  Increasing usage of your inhaler if you have asthma. Zinc gel and zinc lozenges, taken in the first 24 hours of the  common cold, can shorten the duration and lessen the severity of symptoms. Pain medicines may help with fever, muscle aches, and throat pain. A variety of non-prescription medicines are available to treat congestion and runny nose. Your caregiver can make recommendations and may  suggest nasal or lung inhalers for other symptoms.  HOME CARE INSTRUCTIONS   Only take over-the-counter or prescription medicines for pain, discomfort, or fever as directed by your caregiver.  Use a warm mist humidifier or inhale steam from a shower to increase air moisture. This may keep secretions moist and make it easier to breathe.  Drink enough water and fluids to keep your urine clear or pale yellow.  Rest as needed.  Return to work when your temperature has returned to normal or as your caregiver advises. You may need to stay home longer to avoid infecting others. You can also use a face mask and careful hand washing to prevent spread of the virus. SEEK MEDICAL CARE IF:   After the first few days, you feel you are getting worse rather than better.  You need your caregiver's advice about medicines to control symptoms.  You develop chills, worsening shortness of breath, or brown or red sputum. These may be signs of pneumonia.  You develop yellow or brown nasal discharge or pain in the face, especially when you bend forward. These may be signs of sinusitis.  You develop a fever, swollen neck glands, pain with swallowing, or white areas in the back of your throat. These may be signs of strep throat. SEEK IMMEDIATE MEDICAL CARE IF:   You have a fever.  You develop severe or persistent headache, ear pain, sinus pain, or chest pain.  You develop wheezing, a prolonged cough, cough up blood, or have a change in your usual mucus (if you have chronic lung disease).  You develop sore muscles or a stiff neck. Document Released: 04/10/2001 Document Revised: 01/07/2012 Document Reviewed: 02/16/2011 [facility] Patient Information 2014 [place], [place].

## 2014-01-25 NOTE — Telephone Encounter (Signed)
Called patient and let him know that cxr was normal.  Continue tessalon perles for cough and ibuprofen for pharyngitis. If not better in 7 days, return to care.   Marena ChancyStephanie Tymothy Cass, PGY-3 Family Medicine Resident

## 2014-01-25 NOTE — Progress Notes (Signed)
Patient ID: [name]    DOB: 04/17/1966, 48 y.o.   MRN: [id number] --- Subjective:  [name] is a 48 y.o.male who presents with sore throat and cough.  Started 10 days ago with sore throat, headache, myalgias, cough, chills. Chills and myalgias have since resolved but he continues to have cough and sore throat.  Cough is productive of yellow phlegm. He denies any shortness of breath. Cough is associated with chest discomfort. Sore throat occurs with coughing. No rhinorrhea, no congestion, no ear pain.  Took robitussin which helped mildly.  He had a coworker with strep throat 10 days ago when his symptoms started. His son and daughter are sick at home with similar symptoms.   ROS: see HPI Past Medical History: reviewed and updated medications and allergies. Social History: Tobacco: none  Objective: Filed Vitals:   01/25/14 1027  BP: 118/76  Pulse: 77  Temp: 98.8 F (37.1 C)   O2: 96% on RA  Physical Examination:   General appearance - alert, well appearing, in no distress Ears - bilateral TM's and external ear canals normal Nose - erythematous and congested nasal turbinates bilaterally Mouth - erythematous oropharynx, no tonsillar exudates Neck - supple, no significant adenopathy Chest - clear to auscultation, no wheezes, faint crackles in lower bases bilaterally, normal work of breathing Heart - normal rate, regular rhythm, normal S1, S2, no murmurs

## 2014-01-28 ENCOUNTER — Telehealth: Payer: Self-pay | Admitting: Family Medicine

## 2014-01-28 NOTE — Telephone Encounter (Signed)
Patient seen by Dr. Gwenlyn SaranLosq on 01/25/14. Patient was told to call back if the treatment that was given did not help. He is still having issues. Please call and advise [phone]

## 2014-01-28 NOTE — Telephone Encounter (Signed)
Pt states he is still coughing. No fever.  Pt states he feels he needs to cough mucous up but cannot get it up.  I told pt to try Mucinex OTC and if fever [facility]  [name], Darlyne RussianKristen L, CMA

## 2014-03-03 ENCOUNTER — Ambulatory Visit (INDEPENDENT_AMBULATORY_CARE_PROVIDER_SITE_OTHER): Payer: Self-pay | Admitting: Family Medicine

## 2014-03-03 ENCOUNTER — Encounter: Payer: Self-pay | Admitting: Family Medicine

## 2014-03-03 VITALS — BP 112/68 | HR 56 | Temp 98.3°F | Ht 73.0 in | Wt 199.0 lb

## 2014-03-03 DIAGNOSIS — R059 Cough, unspecified: Secondary | ICD-10-CM

## 2014-03-03 DIAGNOSIS — R05 Cough: Secondary | ICD-10-CM

## 2014-03-03 MED ORDER — ALBUTEROL SULFATE HFA 108 (90 BASE) MCG/ACT IN AERS: 2.0000 | INHALATION_SPRAY | Freq: Four times a day (QID) | RESPIRATORY_TRACT | Status: DC | PRN

## 2014-03-03 MED ORDER — FLUTICASONE PROPIONATE 50 MCG/ACT NA SUSP: 2.0000 | Freq: Every day | NASAL | Status: DC

## 2014-03-03 MED ORDER — SPACER/AERO-HOLDING CHAMBERS DEVI: Status: AC

## 2014-03-03 MED ORDER — IPRATROPIUM BROMIDE 0.06 % NA SOLN: 2.0000 | Freq: Four times a day (QID) | NASAL | Status: DC

## 2014-03-03 MED ORDER — PREDNISONE 50 MG PO TABS: ORAL_TABLET | ORAL | Status: DC

## 2014-03-03 NOTE — Assessment & Plan Note (Signed)
Persistent cough w/ likely allergic component causing post nasal drip leading to cough. Pt also w/ pneumonitis and what appears to be asthma exacerbation.  - start flonase and intranasal atrovent - zyrtec - start 5 day course of prednisone and albuterol PRN - Repeat CXR

## 2014-03-03 NOTE — Patient Instructions (Signed)
Your cough is likely coming from post nasal drip which is irritating the lungs This is either from a mild viral infection or most likely allergies Please start flonase and atrovent for the allergies and dripping. Flonase may take 2-4 weeks to fully work Please start the prednisone and atrovent for your coughing, wheezing and lung irritation Please go get yoruxray today to make sure you do not have  Pneumonia Come back in 2-4 weeks if you are not better.

## 2014-03-03 NOTE — Progress Notes (Signed)
[name] is a 48 y.o. male who presents to [facility] today for cough  Treated 5 wks ago for non-strep pharyngitis w/ ibuprofen and tessalon perles. REVIEWED previous clinic note. Still w/ persistent cough. No better. Denies LE swelling, CP. No further fever, malaise, sore throat. Pt feels like cannot take full breath. Exercise tolerance is at 50% of normal.   The following portions of the patient's history were reviewed and updated as appropriate: allergies, current medications, past medical history, family and social history, and problem list.  Patient is a nonsmoker .  Past Medical History  Diagnosis Date  . Sigmoid volvulus 2008    ROS as above otherwise neg.    Medications reviewed. Current Outpatient Prescriptions  Medication Sig Dispense Refill  . albuterol (PROVENTIL HFA;VENTOLIN HFA) 108 (90 BASE) MCG/ACT inhaler Inhale 2 puffs into the lungs every 6 (six) hours as needed for wheezing or shortness of breath.  1 Inhaler  2  . benzonatate (TESSALON PERLES) 100 MG capsule Take 1 capsule (100 mg total) by mouth 3 (three) times daily as needed for cough.  30 capsule  0  . cyclobenzaprine (FLEXERIL) 10 MG tablet Take 10 mg by mouth 3 (three) times daily as needed. Will cause drowsiness       . fluticasone (FLONASE) 50 MCG/ACT nasal spray Place 2 sprays into both nostrils daily.  16 g  2  . ibuprofen (ADVIL,MOTRIN) 600 MG tablet Take 1 tablet (600 mg total) by mouth every 8 (eight) hours as needed.  30 tablet  0  . ipratropium (ATROVENT) 0.06 % nasal spray Place 2 sprays into both nostrils 4 (four) times daily.  15 mL  12  . omeprazole (PRILOSEC) 40 MG capsule Take 40 mg by mouth daily.        . predniSONE (DELTASONE) 50 MG tablet Take daily with breakfast  5 tablet  0  . pseudoephedrine (SUDAFED) 30 MG tablet Take 30-60 mg by mouth every 6 (six) hours as needed.        Marland Kitchen. Spacer/Aero-Holding Rudean Curthambers DEVI Use with inhaler  1 each  prn   No current facility-administered medications for  this visit.    Exam:  BP 112/68  Pulse 56  Temp(Src) 98.3 F (36.8 C) (Oral)  Ht 6\' 1"  (1.854 m)  Wt 199 lb (90.266 kg)  BMI 26.26 kg/m2 Gen: Well NAD HEENT: EOMI,  MMM Lungs: diffuse wheezing, no crackles or ronchi or consolidation. Nml WOB Heart: RRR no MRG Abd: NABS, NT, ND Exts: Non edematous BL  LE, warm and well perfused.   No results found for this or any previous visit (from the past 72 hour(s)).  A/P (as seen in Problem list)  Cough Persistent cough w/ likely allergic component causing post nasal drip leading to cough. Pt also w/ pneumonitis and what appears to be asthma exacerbation.  - start flonase and intranasal atrovent - zyrtec - start 5 day course of prednisone and albuterol PRN - Repeat CXR  Reviewed previous xray - nml

## 2014-03-04 ENCOUNTER — Telehealth: Payer: Self-pay | Admitting: Family Medicine

## 2014-03-04 DIAGNOSIS — R059 Cough, unspecified: Secondary | ICD-10-CM

## 2014-03-04 DIAGNOSIS — R05 Cough: Secondary | ICD-10-CM

## 2014-03-04 NOTE — Telephone Encounter (Signed)
Pt called and would like Dr. Konrad DoloresMerrell to call him. He said that it was personal. Myriam Jacobsonjw

## 2014-03-08 MED ORDER — FLUTICASONE PROPIONATE 50 MCG/ACT NA SUSP: 2.0000 | Freq: Every day | NASAL | Status: DC

## 2014-03-08 MED ORDER — ALBUTEROL SULFATE HFA 108 (90 BASE) MCG/ACT IN AERS
2.0000 | INHALATION_SPRAY | Freq: Four times a day (QID) | RESPIRATORY_TRACT | Status: DC | PRN
Start: 2014-03-08 — End: 2018-12-23

## 2014-03-08 NOTE — Telephone Encounter (Signed)
Unable to afford the flonase as pt said it was around $60 as well as the atrovent  Prednisone gave pt palpitations.  Breathing is better overall but still congested.   Will call in flonase and albuterol to [facility] on [address]  Pt to try to pick up OTC if Rx is too expensive. Pt to also try Mucinex-D and nasal saline.  Pt may benefit from decadron 10mg  injection in office as this may have fewer side effects than the prednisone and would give 54-72hrs of relief.   Pt aware but very leery of injection.  Shelly Flattenavid Merrell, MD Family Medicine PGY-3 03/08/2014, 9:05 AM

## 2014-03-15 ENCOUNTER — Ambulatory Visit (HOSPITAL_COMMUNITY)
Admission: RE | Admit: 2014-03-15 | Discharge: 2014-03-15 | Disposition: A | Discharge status: Home / Self Care | Payer: Self-pay | Source: Ambulatory Visit | Attending: Family Medicine | Admitting: Family Medicine

## 2014-03-15 DIAGNOSIS — R059 Cough, unspecified: Secondary | ICD-10-CM | POA: Insufficient documentation

## 2014-03-15 DIAGNOSIS — R05 Cough: Secondary | ICD-10-CM | POA: Insufficient documentation

## 2014-03-16 ENCOUNTER — Telehealth: Payer: Self-pay | Admitting: Family Medicine

## 2014-03-16 NOTE — Telephone Encounter (Signed)
Please call pt to inform of negative results

## 2014-03-16 NOTE — Telephone Encounter (Signed)
LMOVM informing pt that "your test results were negative, please call with any other questions". Princella PellegriniJessica D Yanitza Shvartsman

## 2014-03-16 NOTE — Telephone Encounter (Signed)
Pt called and would like his X-ray results. jw

## 2014-03-16 NOTE — Telephone Encounter (Signed)
xrays done today.  Will forward to MD. Princella PellegriniJessica D Fleeger

## 2014-08-09 ENCOUNTER — Ambulatory Visit: Payer: Self-pay

## 2015-08-17 ENCOUNTER — Ambulatory Visit (INDEPENDENT_AMBULATORY_CARE_PROVIDER_SITE_OTHER): Payer: Self-pay | Admitting: Family Medicine

## 2015-08-17 VITALS — BP 121/76 | HR 61 | Temp 98.4°F | Ht 73.0 in | Wt 210.0 lb

## 2015-08-17 DIAGNOSIS — M26621 Arthralgia of right temporomandibular joint: Secondary | ICD-10-CM

## 2015-08-17 MED ORDER — NAPROXEN 500 MG PO TABS: 500.0000 mg | ORAL_TABLET | Freq: Two times a day (BID) | ORAL | Status: DC

## 2015-08-17 NOTE — Patient Instructions (Addendum)

## 2015-08-17 NOTE — Progress Notes (Signed)
.      Subjective    [name] is a 49 y.o. male that presents for a follow-up visit for chronic issues.   1. Right jaw pain: Symptoms started one week ago after eating some hard banana chips. Symptoms have been worsening. He has pain with eating and opening his mouth wide. He developed some right shoulder pain about two days ago, for which he drank some tea and pain improved the next day. He took ibuprofen 200mg -400mg  daily for three days. No associated headache, fevers, vision loss/changes.  Social History  Substance Use Topics  . Smoking status: Never Smoker   . Smokeless tobacco: Not on file  . Alcohol Use: No    No Known Allergies  ROS  Per HPI   Objective   BP 121/76 mmHg  Pulse 61  Temp(Src) 98.4 F (36.9 C) (Oral)  Ht 6\' 1"  (1.854 m)  Wt 210 lb (95.255 kg)  BMI 27.71 kg/m2  General: Well appearing HEENT: Pupils equal and reactive to light/accomodation. Extraocular movements intact bilaterally. Tympanic membranes normal bilaterally. Nares patent bilaterally. Oropharnx clear and moist. No cervical adenopathy bilaterally. Very mild tenderness over right TMJ. No trismus.  Assessment and Plan    Meds ordered this encounter  Medications  . naproxen (NAPROSYN) 500 MG tablet    Sig: Take 1 tablet (500 mg total) by mouth 2 (two) times daily with a meal.    Dispense:  30 tablet    Refill:  0   Temporomandibular joint arthralgia - Naproxen 500mg  BID - follow-up if symptoms do not improve in about two weeks

## 2015-08-24 ENCOUNTER — Telehealth: Payer: Self-pay | Admitting: Family Medicine

## 2015-08-24 NOTE — Telephone Encounter (Signed)
Pt was instructed to call provider if his jaw pain did not subside with the pain medication, which it has not. He would like to know what the next stage would be to help him alleviate this pain. Sadie Reynolds, ASA

## 2015-08-26 NOTE — Telephone Encounter (Signed)
Discussed with patient to allow 2 weeks. If patient would like further treatment, would recommend referral to dentist. If this is his desire, please give him information on available dentists. Patient should be eating soft foods and using medication scheduled. Thanks

## 2015-08-30 ENCOUNTER — Encounter: Payer: Self-pay | Admitting: Family Medicine

## 2015-08-30 ENCOUNTER — Ambulatory Visit (INDEPENDENT_AMBULATORY_CARE_PROVIDER_SITE_OTHER): Payer: Self-pay | Admitting: Family Medicine

## 2015-08-30 VITALS — BP 127/82 | HR 63 | Temp 98.1°F | Ht 73.0 in | Wt 213.2 lb

## 2015-08-30 DIAGNOSIS — M26621 Arthralgia of right temporomandibular joint: Secondary | ICD-10-CM

## 2015-08-30 MED ORDER — CYCLOBENZAPRINE HCL 10 MG PO TABS: 10.0000 mg | ORAL_TABLET | Freq: Every day | ORAL | Status: DC

## 2015-08-30 NOTE — Patient Instructions (Signed)
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 Since this is the first time it has happened I suspect it will improve with time. I would continue using the aleve, but there are 2 other medicines you can use to try and help if the pain is still not getting any better.  I would start with some exercises below, and if you still are not getting any better in a month or so the next step would be going to a dentist. The dentists I would try calling first are listed below  Isometric jaw opening  Isometric jaw exercises are particularly useful for patients with temporomandibular joint dysfunction syndrome. These exercises are performed by applying resistance with an open or loosely fisted hand. In the isometric jaw opening exercise, the patient begins with her mouth open about an inch. The resistance and muscle contraction are held for 5 to 10 seconds before relaxing. This is repeated five times per session. Exercises can be performed with moderate resistance applied several sessions per day, or with maximum resistance 1 session per day.   Isometric jaw forward thrust  The isometric jaw forward thrust exercise is performed by pushing the jaw forward against the hand, holding and then relaxing. This is repeated five times per session.  Dentist - [facility] [place] [address] [place], [place] [address] [phone]  Georgia LopesSCOTT M. JENSEN, DMD PHONE [phone] [place]  Scott A. Webb SilversmithWelch, D.D.S., P.A.  .  [address]  .  [place], [place] [address]  .  [phone]

## 2015-08-30 NOTE — Progress Notes (Signed)
   Subjective:    Patient ID: [name], male    DOB: 01/08/1966, 49 y.o.   MRN: [id number]  HPI  Patient presents for Same Day Appointment  CC: jaw pain  # Right jaw pain:  Started about 1 month ago, thinks it first started 2 days after eating banana chips (but admits he has eaten these before)  [facility] 2 [facility]  Taking aleve that was prescribed twice a day since then  Still hving pain in his right jaw joint that occurs only with opening his mouth wide or chewing food  Having issues chewing any sort of food at all  No feels of lock jaw ROS: no fevers, no nausea, no vomiting, no blurry vision  Social Hx: never smoker  Review of Systems   See HPI for ROS. All other systems reviewed and are negative.  Past medical history, surgical, family, and social history reviewed and updated in the EMR as appropriate.  Objective:  BP 127/82 mmHg  Pulse 63  Temp(Src) 98.1 F (36.7 C) (Oral)  Ht 6\' 1"  (1.854 m)  Wt 213 lb 3.2 oz (96.707 kg)  BMI 28.13 kg/m2 Vitals and nursing note reviewed  General: NAD ENTM: no oropharyngeal lesions, noted multiple molars missing on upper part of mouth with denture in place. There is minimal tenderness to palpation of the right TMJ. There is no palpable clicking of the TMJ when mouth is opening/closing, however patient feels something.   Assessment & Plan:  1. Arthralgia of right temporomandibular joint Still suspect acute TMJ irritation/mechanical pain. Given this is the first time he has had it before hopeful that it will resolve with time. Recommended addition of muscle relaxer, isometric exercises went over in clinic. Also given list of dentists in the area that advertise dealing with TMJ, recommended he wait at least 1 month before scheduling appt.

## 2015-08-31 ENCOUNTER — Ambulatory Visit: Payer: Self-pay | Admitting: Family Medicine

## 2016-01-06 ENCOUNTER — Encounter: Payer: Self-pay | Admitting: Family Medicine

## 2016-01-06 ENCOUNTER — Ambulatory Visit (INDEPENDENT_AMBULATORY_CARE_PROVIDER_SITE_OTHER): Payer: BLUE CROSS/BLUE SHIELD | Admitting: Family Medicine

## 2016-01-06 VITALS — BP 128/76 | HR 58 | Temp 98.2°F | Ht 73.0 in | Wt 206.3 lb

## 2016-01-06 DIAGNOSIS — M722 Plantar fascial fibromatosis: Secondary | ICD-10-CM

## 2016-01-06 NOTE — Patient Instructions (Addendum)
Plantar Fasciitis Plantar fasciitis is a painful foot condition that affects the heel. It occurs when the band of tissue that connects the toes to the heel bone (plantar fascia) becomes irritated. This can happen after exercising too much or doing other repetitive activities (overuse injury). The pain from plantar fasciitis can range from mild irritation to severe pain that makes it difficult for you to walk or move. The pain is usually worse in the morning or after you have been sitting or lying down for a while. CAUSES This condition may be caused by:  Standing for long periods of time.  Wearing shoes that do not fit.  Doing high-impact activities, including running, aerobics, and ballet.  Being overweight.  Having an abnormal way of walking (gait).  Having tight calf muscles.  Having high arches in your feet.  Starting a new athletic activity. SYMPTOMS The main symptom of this condition is heel pain. Other symptoms include:  Pain that gets worse after activity or exercise.  Pain that is worse in the morning or after resting.  Pain that goes away after you walk for a few minutes. DIAGNOSIS This condition may be diagnosed based on your signs and symptoms. Your health care provider will also do a physical exam to check for:  A tender area on the bottom of your foot.  A high arch in your foot.  Pain when you move your foot.  Difficulty moving your foot. You may also need to have imaging studies to confirm the diagnosis. These can include:  X-rays.  Ultrasound.  MRI. TREATMENT  Treatment for plantar fasciitis depends on the severity of the condition. Your treatment may include:  Rest, ice, and over-the-counter pain medicines to manage your pain.  Exercises to stretch your calves and your plantar fascia.  A splint that holds your foot in a stretched, upward position while you sleep (night splint).  Physical therapy to relieve symptoms and prevent problems in the  future.  Cortisone injections to relieve severe pain.  Extracorporeal shock wave therapy (ESWT) to stimulate damaged plantar fascia with electrical impulses. It is often used as a last resort before surgery.  Surgery, if other treatments have not worked after 12 months. HOME CARE INSTRUCTIONS  Take medicines only as directed by your health care provider.  Avoid activities that cause pain.  Roll the bottom of your foot over a bag of ice or a bottle of cold water. Do this for 20 minutes, 3-4 times a day.  Perform simple stretches as directed by your health care provider.  Try wearing athletic shoes with air-sole or gel-sole cushions or soft shoe inserts.  Wear a night splint while sleeping, if directed by your health care provider.  Keep all follow-up appointments with your health care provider. PREVENTION   Do not perform exercises or activities that cause heel pain.  Consider finding low-impact activities if you continue to have problems.  Lose weight if you need to. The best way to prevent plantar fasciitis is to avoid the activities that aggravate your plantar fascia. SEEK MEDICAL CARE IF:  Your symptoms do not go away after treatment with home care measures.  Your pain gets worse.  Your pain affects your ability to move or do your daily activities.

## 2016-01-06 NOTE — Progress Notes (Signed)
Subjective: [name] is a 50 y.o. male presenting for SDA for foot pain.  Focal mild-moderate sharp pain under the right heel when he gets out of bed worse with each step, improves after 10 - 15 minutes. This started 2 weeks ago, remaining constant since then. Recently wore different dress shoes prior to this. No injury or prior history of this.   - ROS: As above - Notable PMH: IBS, GERD, back pain w/sciatica - Non-smoker  Objective: BP 128/76 mmHg  Pulse 58  Temp(Src) 98.2 F (36.8 C) (Oral)  Ht 6\' 1"  (1.854 m)  Wt 206 lb 4.8 oz (93.577 kg)  BMI 27.22 kg/m2 Gen: Well-appearing 50 y.o. male in no distress Right foot: No deformities, ulcerations or skin breakdown. Sensation intact to monofilament testing normal, PT and DP pulse 2+. Tenderness to palpation at calcaneal insertion of plantar fascia.  Left foot: Normal  Assessment/Plan: [name] is a 50 y.o. male here for plantar fasciitis.  Management reviewed including, NSAIDs prn, ice, rest. Can attempt stretching prior to standing in morning. Consider steroid injection if not improving

## 2018-12-23 ENCOUNTER — Encounter (HOSPITAL_COMMUNITY): Payer: Self-pay | Admitting: Emergency Medicine

## 2018-12-23 ENCOUNTER — Ambulatory Visit (HOSPITAL_COMMUNITY)
Admission: EM | Admit: 2018-12-23 | Discharge: 2018-12-23 | Disposition: A | Discharge status: Home / Self Care | Payer: BLUE CROSS/BLUE SHIELD | Attending: Family Medicine | Admitting: Family Medicine

## 2018-12-23 ENCOUNTER — Other Ambulatory Visit: Payer: Self-pay

## 2018-12-23 DIAGNOSIS — M654 Radial styloid tenosynovitis [de Quervain]: Secondary | ICD-10-CM

## 2018-12-23 MED ORDER — NAPROXEN 375 MG PO TABS: 375.0000 mg | ORAL_TABLET | Freq: Two times a day (BID) | ORAL | 0 refills | Status: AC

## 2018-12-23 NOTE — ED Triage Notes (Signed)
Left wrist pain.  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  Pain for 2 weeks.  Pain radial aspect of left wrist.  Rotation of left forearm causes pain.  Movement of left thumb causes pain.  No known injury

## 2018-12-23 NOTE — Discharge Instructions (Signed)
Brace given Continue conservative management of rest, ice, elevation, and wear brace Take naproxen as needed for pain relief (may cause abdominal discomfort, ulcers, and GI bleeds avoid taking with other NSAIDs) Follow up with PCP or orthopedist if symptoms persists Return or go to the ER if you have any new or worsening symptoms (fever, chills, chest pain, abdominal pain, worsening pain despite treatment, redness, swelling, numbness/ tingling, etc...)

## 2018-12-23 NOTE — Progress Notes (Deleted)
   Subjective:   Patient ID: [name]    DOB: September 30, 1966, 53 y.o. male   MRN: [id number]  [name] is a 53 y.o. male here for "wrist pain"  No problems updated. - ***  Review of Systems:  Per HPI.   PMFSH, medications and smoking status reviewed.  Objective:   There were no vitals taken for this visit. Vitals and nursing note reviewed.  General: well nourished, well developed, in no acute distress with non-toxic appearance HEENT: normocephalic, atraumatic, moist mucous membranes Neck: supple, non-tender without lymphadenopathy CV: regular rate and rhythm without murmurs, rubs, or gallops, no lower extremity edema Lungs: clear to auscultation bilaterally with normal work of breathing Abdomen: soft, non-tender, non-distended, no masses or organomegaly palpable, normoactive bowel sounds Skin: warm, dry, no rashes or lesions Extremities: warm and well perfused, normal tone MSK: ROM grossly intact, strength intact, gait normal Neuro: Alert and oriented, speech normal Wrist, ***: TTP noted at the ***. Inspection yielded no*** erythema, ecchymosis, bony deformity, or swelling. ROM ***full with good flexion and extension and ulnar/radial deviation that is ***symmetrical with opposite wrist. Palpation is ***normal over metacarpals, scaphoid, lunate, and TFCC; tendons with***out tenderness/swelling. Strength 5***/5 in all directions without pain. Negative*** Finkelstein, tinel's and phalens.   Assessment & Plan:   No problem-specific Assessment & Plan notes found for this encounter.  No orders of the defined types were placed in this encounter.  No orders of the defined types were placed in this encounter.   Orpah Cobb, DO PGY-1, [facility] 12/23/2018 2:55 PM

## 2018-12-23 NOTE — ED Provider Notes (Signed)
[facility]   [id number] 12/23/18 Arrival Time: 1432  CC: Left wrist pain  SUBJECTIVE: History from: patient. [name] is a 53 y.o. male complains of left wrist pain that began 2 weeks ago.  Denies a precipitating event or specific injury, but does a lot repetitive supination/ pronation motions at work.  Localizes the pain to the radial aspect of wrist.  Describes the pain as intermittent and worse with supination and pronation movements.  Has NOT tried OTC medications.  Denies similar symptoms in the past.  Denies fever, chills, erythema, ecchymosis, effusion, weakness, numbness and tingling.      ROS: As per HPI.  Past Medical History:  Diagnosis Date  . Sigmoid volvulus (HCC) 2008   Past Surgical History:  Procedure Laterality Date  . LAPAROSCOPIC SIGMOID COLECTOMY  2008   Open   No Known Allergies No current facility-administered medications on file prior to encounter.    Current Outpatient Medications on File Prior to Encounter  Medication Sig Dispense Refill  . Spacer/Aero-Holding Rudean Curt Use with inhaler 1 each prn   Social History   Socioeconomic History  . Marital status: Married    Spouse name: Not on file  . Number of children: Not on file  . Years of education: Not on file  . Highest education level: Not on file  Occupational History  . Not on file  Social Needs  . Financial resource strain: Not on file  . Food insecurity:    Worry: Not on file    Inability: Not on file  . Transportation needs:    Medical: Not on file    Non-medical: Not on file  Tobacco Use  . Smoking status: Never Smoker  Substance and Sexual Activity  . Alcohol use: No  . Drug use: No  . Sexual activity: Not on file  Lifestyle  . Physical activity:    Days per week: Not on file    Minutes per session: Not on file  . Stress: Not on file  Relationships  . Social connections:    Talks on phone: Not on file    Gets together: Not on file    Attends  religious service: Not on file    Active member of club or organization: Not on file    Attends meetings of clubs or organizations: Not on file    Relationship status: Not on file  . Intimate partner violence:    Fear of current or ex partner: Not on file    Emotionally abused: Not on file    Physically abused: Not on file    Forced sexual activity: Not on file  Other Topics Concern  . Not on file  Social History Narrative  . Not on file   History reviewed. No pertinent family history.  OBJECTIVE:  Vitals:   12/23/18 1511  BP: 123/82  Pulse: (!) 58  Resp: 18  Temp: (!) 97.1 F (36.2 C)  TempSrc: Oral  SpO2: 98%    General appearance: Alert; in no acute distress.  Head: NCAT Lungs: Normal respiratory effort CV: Radial pulse 2 +; cap refill <2 secs Musculoskeletal: left wrist Inspection: Skin warm, dry, clear and intact without obvious erythema, effusion, or ecchymosis.  Palpation: TTP over lateral aspect of distal radius, 1st metacarpal and snuff box  ROM: FROM active and passive Strength: 5/5 shld abduction, 5/5 shld adduction, 5/5 elbow flexion, 5/5 elbow extension, 5/5 wrist flexion, 5/5 wrist extension, 5/5 grip strength + Finkelstein's; - Phalen's Skin: warm and  dry Neurologic: Ambulates without difficulty; Sensation intact about the upper/ lower extremities Psychological: alert and cooperative; normal mood and affect  ASSESSMENT & PLAN:  1. Tendinitis, de Quervain's     Meds ordered this encounter  Medications  . naproxen (NAPROSYN) 375 MG tablet    Sig: Take 1 tablet (375 mg total) by mouth 2 (two) times daily.    Dispense:  20 tablet    Refill:  0    Order Specific Question:   Supervising Provider    Answer:   Eustace Moore [[id number]]   Brace given Continue conservative management of rest, ice, elevation, and wear brace Take naproxen as needed for pain relief (may cause abdominal discomfort, ulcers, and GI bleeds avoid taking with other  NSAIDs) Follow up with PCP or orthopedist if symptoms persists Return or go to the ER if you have any new or worsening symptoms (fever, chills, chest pain, abdominal pain, worsening pain despite treatment, redness, swelling, numbness/ tingling, etc...)   Reviewed expectations re: course of current medical issues. Questions answered. Outlined signs and symptoms indicating need for more acute intervention. Patient verbalized understanding. After Visit Summary given.    Rennis Harding, PA-C 12/23/18 1547

## 2018-12-24 ENCOUNTER — Ambulatory Visit: Payer: BLUE CROSS/BLUE SHIELD | Admitting: Family Medicine

## 2018-12-31 ENCOUNTER — Ambulatory Visit: Payer: BLUE CROSS/BLUE SHIELD

## 2019-01-01 ENCOUNTER — Ambulatory Visit: Payer: BLUE CROSS/BLUE SHIELD

## 2019-01-02 ENCOUNTER — Ambulatory Visit: Payer: BLUE CROSS/BLUE SHIELD

## 2019-01-08 ENCOUNTER — Encounter (HOSPITAL_COMMUNITY): Payer: Self-pay | Admitting: Emergency Medicine

## 2019-01-08 ENCOUNTER — Other Ambulatory Visit: Payer: Self-pay

## 2019-01-08 ENCOUNTER — Ambulatory Visit (HOSPITAL_COMMUNITY)
Admission: EM | Admit: 2019-01-08 | Discharge: 2019-01-08 | Disposition: A | Discharge status: Home / Self Care | Payer: BLUE CROSS/BLUE SHIELD | Attending: Family Medicine | Admitting: Family Medicine

## 2019-01-08 DIAGNOSIS — M654 Radial styloid tenosynovitis [de Quervain]: Secondary | ICD-10-CM | POA: Diagnosis not present

## 2019-01-08 DIAGNOSIS — M25532 Pain in left wrist: Secondary | ICD-10-CM | POA: Diagnosis not present

## 2019-01-08 MED ORDER — METHYLPREDNISOLONE 4 MG PO TBPK: ORAL_TABLET | ORAL | 0 refills | Status: AC

## 2019-01-08 NOTE — ED Provider Notes (Signed)
[facility]    CSN: [id number] Arrival date & time: 01/08/19  1405     History   Chief Complaint Chief Complaint  Patient presents with  . Wrist Pain    HPI [name] is a 53 y.o. male.   HPI  Patient presents with ongoing Left wrist pain that is worse with movement of his thumb for the past month. He states he did repetitive opening of boxes at work for approximately 3-4 weeks prior to pain beginning. He was previously seen here and diagnosed with tendonitis. He has been wearing thumb spica brace, resting wrist and taking naproxen 375 mg twice daily without improvement in his pain. He describes pain as burning and states it sometimes radiates up his arm. He denies any numbness or tingling and has normal grip strength.   Past Medical History:  Diagnosis Date  . Sigmoid volvulus (HCC) 2008    Patient Active Problem List   Diagnosis Date Noted  . Tension headache 09/07/2011  . IRRITABLE BOWEL SYNDROME 12/26/2006    Past Surgical History:  Procedure Laterality Date  . LAPAROSCOPIC SIGMOID COLECTOMY  2008   Open       Home Medications    Prior to Admission medications   Medication Sig Start Date End Date Taking? Authorizing Provider  naproxen (NAPROSYN) 375 MG tablet Take 1 tablet (375 mg total) by mouth 2 (two) times daily. 12/23/18  Yes Wurst, [place], PA-C  methylPREDNISolone (MEDROL DOSEPAK) 4 MG TBPK tablet tad 01/08/19   Eustace Moore, MD  Spacer/Aero-Holding Rudean Curt Use with inhaler 03/03/14   Ozella Rocks, MD    Family History History reviewed. No pertinent family history.  Social History Social History   Tobacco Use  . Smoking status: Never Smoker  . Smokeless tobacco: Never Used  Substance Use Topics  . Alcohol use: No  . Drug use: No     Allergies   Patient has no known allergies.   Review of Systems Review of Systems  Constitutional: Negative for chills and fever.  HENT: Negative for ear pain and sore  throat.   Eyes: Negative for pain and visual disturbance.  Respiratory: Negative for cough and shortness of breath.   Cardiovascular: Negative for chest pain and palpitations.  Gastrointestinal: Negative for abdominal pain and vomiting.  Genitourinary: Negative for dysuria and hematuria.  Musculoskeletal: Negative for arthralgias and back pain.       Pain to Left wrist with movement of thumb  Skin: Negative for color change and rash.  Neurological: Negative for seizures and syncope.  Psychiatric/Behavioral: Negative.   All other systems reviewed and are negative.    Physical Exam Triage Vital Signs ED Triage Vitals  Enc Vitals Group     BP 01/08/19 1438 127/80     Pulse Rate 01/08/19 1438 (!) 59     Resp 01/08/19 1438 16     Temp 01/08/19 1438 98.6 F (37 C)     Temp Source 01/08/19 1438 Oral     SpO2 01/08/19 1438 98 %     Weight --      Height --      Head Circumference --      Peak Flow --      Pain Score 01/08/19 1439 10     Pain Loc --      Pain Edu? --      Excl. in GC? --    No data found.  Updated Vital Signs BP 127/80 (BP Location: Right Arm)  Pulse (!) 59   Temp 98.6 F (37 C) (Oral)   Resp 16   SpO2 98%   Physical Exam Constitutional:      General: He is not in acute distress.    Appearance: He is well-developed.  HENT:     Head: Normocephalic and atraumatic.  Eyes:     Conjunctiva/sclera: Conjunctivae normal.     Pupils: Pupils are equal, round, and reactive to light.  Neck:     Musculoskeletal: Normal range of motion.  Cardiovascular:     Rate and Rhythm: Normal rate.  Pulmonary:     Effort: Pulmonary effort is normal. No respiratory distress.  Abdominal:     General: There is no distension.     Palpations: Abdomen is soft.  Musculoskeletal: Normal range of motion.       Arms:  Skin:    General: Skin is warm and dry.  Neurological:     General: No focal deficit present.     Mental Status: He is alert and oriented to person, place,  and time. Mental status is at baseline.      UC Treatments / Results  Labs (all labs ordered are listed, but only abnormal results are displayed) Labs Reviewed - No data to display  EKG None  Radiology No results found.  Procedures Procedures (including critical care time)  Medications Ordered in UC Medications - No data to display  Initial Impression / Assessment and Plan / UC Course  I have reviewed the triage vital signs and the nursing notes.  Pertinent labs & imaging results that were available during my care of the patient were reviewed by me and considered in my medical decision making (see chart for details).      Final Clinical Impressions(s) / UC Diagnoses   Final diagnoses:  Left wrist pain  Radial styloid tenosynovitis     Discharge Instructions     Use ice for 20 min every couple of hours Take the medrol pak as instructed When the medrol ( steroid anti inflammatory) is done, take advil or aleve Wear brace at all times when up.  May remove for washing Follow up with orthopedic if you fail to [facility]   ED Prescriptions    Medication Sig Dispense Auth. Provider   methylPREDNISolone (MEDROL DOSEPAK) 4 MG TBPK tablet tad 21 tablet Eustace Moore, MD     Controlled Substance Prescriptions [place] Controlled Substance Registry consulted? Not Applicable   Eustace Moore, MD 01/08/19 1626

## 2019-01-08 NOTE — ED Triage Notes (Signed)
Pt presents to [facility] for continued pain to left wrist with brace wearing since his visit 2 weeks ago.

## 2019-01-08 NOTE — Discharge Instructions (Signed)
Use ice for 20 min every couple of hours Take the medrol pak as instructed When the medrol ( steroid anti inflammatory) is done, take advil or aleve Wear brace at all times when up.  May remove for washing Follow up with orthopedic if you fail to imporve

## 2019-01-09 ENCOUNTER — Ambulatory Visit: Payer: BLUE CROSS/BLUE SHIELD

## 2019-01-29 ENCOUNTER — Other Ambulatory Visit: Payer: Self-pay

## 2019-01-29 ENCOUNTER — Telehealth: Payer: Self-pay | Admitting: Family Medicine

## 2019-01-29 ENCOUNTER — Telehealth: Payer: BLUE CROSS/BLUE SHIELD

## 2019-01-29 NOTE — Telephone Encounter (Signed)
Called patient for his scheduled virtual visit. He is not interested in a virtual visit, and wanted to be seen in person if possible. Reviewed his chart - has been seen twice at urgent care for wrist pain. I recommended that to [facility] services he consider going to an orthopedics office for evaluation, where they can do xrays and treat his wrist issue. He is agreeable. Gave contact info for [employer].  Patient appreciative. Latrelle Dodrill, MD

## 2019-05-11 ENCOUNTER — Other Ambulatory Visit: Payer: Self-pay | Admitting: Sports Medicine

## 2019-05-11 DIAGNOSIS — M25532 Pain in left wrist: Secondary | ICD-10-CM

## 2019-06-04 ENCOUNTER — Other Ambulatory Visit: Payer: BLUE CROSS/BLUE SHIELD

## 2019-07-03 ENCOUNTER — Other Ambulatory Visit: Payer: BC Managed Care – PPO

## 2019-08-04 ENCOUNTER — Other Ambulatory Visit: Payer: BC Managed Care – PPO

## 2019-08-20 ENCOUNTER — Other Ambulatory Visit: Payer: BC Managed Care – PPO

## 2019-09-11 ENCOUNTER — Other Ambulatory Visit: Payer: BC Managed Care – PPO

## 2019-10-14 ENCOUNTER — Other Ambulatory Visit: Payer: Self-pay | Admitting: Sports Medicine

## 2019-10-17 ENCOUNTER — Other Ambulatory Visit: Payer: BC Managed Care – PPO

## 2019-11-11 ENCOUNTER — Other Ambulatory Visit: Payer: BC Managed Care – PPO

## 2019-11-30 ENCOUNTER — Other Ambulatory Visit: Payer: BC Managed Care – PPO

## 2019-12-21 ENCOUNTER — Inpatient Hospital Stay: Admission: RE | Admit: 2019-12-21 | Payer: BC Managed Care – PPO | Source: Ambulatory Visit

## 2021-06-03 ENCOUNTER — Other Ambulatory Visit: Payer: Self-pay

## 2021-06-03 ENCOUNTER — Encounter (HOSPITAL_COMMUNITY): Payer: Self-pay | Admitting: Emergency Medicine

## 2021-06-03 ENCOUNTER — Emergency Department (HOSPITAL_COMMUNITY): Payer: Medicaid Other

## 2021-06-03 ENCOUNTER — Emergency Department (HOSPITAL_COMMUNITY)
Admission: EM | Admit: 2021-06-03 | Discharge: 2021-06-03 | Disposition: A | Discharge status: Home / Self Care | Payer: Medicaid Other | Attending: Emergency Medicine | Admitting: Emergency Medicine

## 2021-06-03 DIAGNOSIS — M545 Low back pain, unspecified: Secondary | ICD-10-CM | POA: Diagnosis present

## 2021-06-03 DIAGNOSIS — Y92410 Unspecified street and highway as the place of occurrence of the external cause: Secondary | ICD-10-CM | POA: Diagnosis not present

## 2021-06-03 DIAGNOSIS — M542 Cervicalgia: Secondary | ICD-10-CM | POA: Insufficient documentation

## 2021-06-03 MED ORDER — LIDOCAINE 5 % EX PTCH: 1.0000 | MEDICATED_PATCH | CUTANEOUS | 0 refills | Status: AC

## 2021-06-03 MED ORDER — METHOCARBAMOL 500 MG PO TABS: 500.0000 mg | ORAL_TABLET | Freq: Three times a day (TID) | ORAL | 0 refills | Status: AC | PRN
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
## 2022-10-07 ENCOUNTER — Ambulatory Visit (HOSPITAL_COMMUNITY)
Admission: EM | Admit: 2022-10-07 | Discharge: 2022-10-07 | Disposition: A | Discharge status: Home / Self Care | Payer: Medicaid Other | Attending: Physician Assistant | Admitting: Physician Assistant

## 2022-10-07 ENCOUNTER — Encounter (HOSPITAL_COMMUNITY): Payer: Self-pay | Admitting: *Deleted

## 2022-10-07 DIAGNOSIS — M722 Plantar fascial fibromatosis: Secondary | ICD-10-CM

## 2022-10-07 DIAGNOSIS — M79672 Pain in left foot: Secondary | ICD-10-CM

## 2022-10-07 MED ORDER — IBUPROFEN 600 MG PO TABS: 600.0000 mg | ORAL_TABLET | Freq: Four times a day (QID) | ORAL | 0 refills | Status: AC | PRN

## 2022-10-07 NOTE — ED Provider Notes (Signed)
[facility]    CSN: [id number] Arrival date & time: 10/07/22  1656      History   Chief Complaint Chief Complaint  Patient presents with   Foot Pain    HPI [name] is a 56 y.o. male.   56 year old male presents with left heel pain.  Patient indicates for the past 4 months he has been having persistent and progressive left heel pain.  He relates that he has intense pain first thing in the morning when he gets up and his feet hit the floor.  He indicates that about 30 minutes after walking his symptoms improved.  However he indicates after sitting for couple hours he gets up again and he has tremendous pain when weight is put on his left heel.  Patient indicates that he is been using some heel cups which has not given him relief.  He indicates that there has been no trauma to the left heel.  Patient is just concerned that he continues to have significant pain from the left heel area.   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 Authorizing Provider  ibuprofen (ADVIL) 600 MG tablet Take 1 tablet (600 mg total) by mouth every 6 (six) hours as needed. 10/07/22  Yes Ellsworth Lennox, PA-C  lidocaine (LIDODERM) 5 % Place 1 patch onto the skin daily. Remove & Discard patch within 12 hours or as directed by MD 06/03/21   Haskel Schroeder, PA-C  methocarbamol (ROBAXIN) 500 MG tablet Take 1 tablet (500 mg total) by mouth every 8 (eight) hours as needed for muscle spasms. 06/03/21   Haskel Schroeder, PA-C  methylPREDNISolone (MEDROL DOSEPAK) 4 MG TBPK tablet tad 01/08/19   Eustace Moore, MD   naproxen (NAPROSYN) 375 MG tablet Take 1 tablet (375 mg total) by mouth 2 (two) times daily. 12/23/18   Rennis Harding, PA-C  Spacer/Aero-Holding Chambers DEVI Use with inhaler 03/03/14   Ozella Rocks, MD    Family History History reviewed. No pertinent family history.  Social History Social History   Tobacco Use   Smoking status: Never   Smokeless tobacco: Never  Vaping Use   Vaping Use: Never used  Substance Use Topics   Alcohol use: No   Drug use: No     Allergies   Patient has no known allergies.   Review of Systems Review of Systems  Musculoskeletal:  Positive for gait problem (left heel pain).     Physical Exam Triage Vital Signs ED Triage Vitals  Enc Vitals Group     BP 10/07/22 1735 124/69     Pulse Rate 10/07/22 1735 (!) 54     Resp 10/07/22 1735 16     Temp 10/07/22 1735 98.1 F (36.7 C)     Temp Source 10/07/22 1735 Oral     SpO2 10/07/22 1735 99 %     Weight --      Height --      Head Circumference --      Peak Flow --      Pain  Score 10/07/22 1737 5     Pain Loc --      Pain Edu? --      Excl. in GC? --    No data found.  Updated Vital Signs BP 124/69   Pulse (!) 54   Temp 98.1 F (36.7 C) (Oral)   Resp 16   SpO2 99%   Visual Acuity Right Eye Distance:   Left Eye Distance:   Bilateral Distance:    Right Eye Near:   Left Eye Near:    Bilateral Near:     Physical Exam Constitutional:      Appearance: Normal appearance.  Musculoskeletal:       Feet:  Feet:     Comments: Left foot: Pain is palpated at the plantar fascia insertion area of the heel.  Range of motion is normal, there is no redness or usual swelling present. Neurological:     Mental Status: He is alert.      UC Treatments / Results  Labs (all labs ordered are listed, but only abnormal results are displayed) Labs Reviewed - No data to display  EKG   Radiology No results found.  Procedures Procedures (including critical care time)  Medications  Ordered in UC Medications - No data to display  Initial Impression / Assessment and Plan / UC Course  I have reviewed the triage vital signs and the nursing notes.  Pertinent labs & imaging results that were available during my care of the patient were reviewed by me and considered in my medical decision making (see chart for details).    Plan: 1.  The left foot pain will be treated with the following: A.  Ibuprofen 600 mg every 8 hours with food to help reduce pain. 2.  Plantar fasciitis of the left heel be treated with the following: A.  Advised soft heel cups. B.  Regular stretching exercises. C.  Advised ice therapy, 10 minutes on 20 minutes off, to help reduce pain and swelling. D.  Advised him using tennis ball to roll the bottom of the foot on a regular basis to help reduce pain and improve stretching. 3.  Advised to consider podiatry evaluation if symptoms fail to improve over the next 30 to 45 days.  Final Clinical Impressions(s) / UC Diagnoses   Final diagnoses:  Foot pain, left  Plantar fasciitis of left foot     Discharge Instructions      Advised to use heel cups to help reduce the pain from the plantar fasciitis. Advised to use regular ice therapy along with regular stretching exercises and rolling of the ball against the bottom of the foot to help reduce pain and discomfort. Advised to use step Achilles exercises to help stretch the fascia. If symptoms fail to improve over the next 30 days advised podiatry evaluation.    ED Prescriptions     Medication Sig Dispense Auth. Provider   ibuprofen (ADVIL) 600 MG tablet Take 1 tablet (600 mg total) by mouth every 6 (six) hours as needed. 30 tablet Ellsworth Lennox, PA-C      PDMP not reviewed this encounter.   Ellsworth Lennox, PA-C 10/07/22 1756

## 2022-10-07 NOTE — ED Triage Notes (Signed)
Denies injury. C/O intermittent pain to left heel onset approx 4 months ago. Pain worse with weight bearing, and worse usually in morning.

## 2022-10-07 NOTE — Discharge Instructions (Addendum)
Advised to use heel cups to help reduce the pain from the plantar fasciitis. Advised to use regular ice therapy along with regular stretching exercises and rolling of the ball against the bottom of the foot to help reduce pain and discomfort. Advised to use step Achilles exercises to help stretch the fascia. If symptoms fail to improve over the next 30 days advised podiatry evaluation.
